# Patient Record
Sex: Male | Born: 1995 | Race: Black or African American | Hispanic: No | Marital: Single | Smoking: Former smoker
Health system: Southern US, Community
[De-identification: ages and names within clinical notes are randomized; demographics above are authoritative.]

## PROBLEM LIST (undated history)

## (undated) DIAGNOSIS — J45909 Unspecified asthma, uncomplicated: Secondary | ICD-10-CM

## (undated) HISTORY — DX: Unspecified asthma, uncomplicated: J45.909

---
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
## 2014-02-03 ENCOUNTER — Emergency Department (HOSPITAL_COMMUNITY)
Admission: EM | Admit: 2014-02-03 | Discharge: 2014-02-03 | Disposition: A | Discharge status: Home / Self Care | Payer: Medicaid Other | Attending: Emergency Medicine | Admitting: Emergency Medicine

## 2014-02-03 ENCOUNTER — Encounter (HOSPITAL_COMMUNITY): Payer: Self-pay | Admitting: Emergency Medicine

## 2014-02-03 DIAGNOSIS — Y92838 Other recreation area as the place of occurrence of the external cause: Secondary | ICD-10-CM

## 2014-02-03 DIAGNOSIS — S7000XA Contusion of unspecified hip, initial encounter: Secondary | ICD-10-CM | POA: Insufficient documentation

## 2014-02-03 DIAGNOSIS — Z791 Long term (current) use of non-steroidal anti-inflammatories (NSAID): Secondary | ICD-10-CM | POA: Insufficient documentation

## 2014-02-03 DIAGNOSIS — M549 Dorsalgia, unspecified: Secondary | ICD-10-CM | POA: Insufficient documentation

## 2014-02-03 DIAGNOSIS — Y9239 Other specified sports and athletic area as the place of occurrence of the external cause: Secondary | ICD-10-CM | POA: Insufficient documentation

## 2014-02-03 DIAGNOSIS — S7002XA Contusion of left hip, initial encounter: Secondary | ICD-10-CM

## 2014-02-03 DIAGNOSIS — Y9351 Activity, roller skating (inline) and skateboarding: Secondary | ICD-10-CM | POA: Insufficient documentation

## 2014-02-03 MED ORDER — TRAMADOL HCL 50 MG PO TABS: 50.0000 mg | ORAL_TABLET | Freq: Four times a day (QID) | ORAL | Status: DC | PRN

## 2014-02-03 MED ORDER — NAPROXEN 500 MG PO TABS: 500.0000 mg | ORAL_TABLET | Freq: Two times a day (BID) | ORAL | Status: DC

## 2014-02-03 NOTE — ED Provider Notes (Signed)
CSN: [id number]     Arrival date & time 02/03/14  1251 History  This chart was scribed for non-physician practitioner Renne CriglerJoshua Jamaria Amborn working with Enid SkeensJoshua M Zavitz, MD by Carl Bestelina Holson, ED Scribe. This patient was seen in room TR11C/TR11C and the patient's care was started at 2:17 PM.    Chief Complaint  Patient presents with  . Fall    HPI Comments: Donald Mcintosh is a 18 y.o. male who presents to the Emergency Department complaining of constant, non-radiating left hip pain that started after the patient fell off of a skateboard two days ago and landed on his left hip.  He states that he felt the metal prongs from his phone charger in his pocket stick into his left hip at the time of the fall.  The patient states that cough, walking, and laying down aggravates the pain.  He states that he has been applying ice to his left hip.     Patient is a 18 y.o. male presenting with fall. The history is provided by the patient. No language interpreter was used.  Fall    History reviewed. No pertinent past medical history. History reviewed. No pertinent past surgical history. History reviewed. No pertinent family history. History  Substance Use Topics  . Smoking status: Never Smoker   . Smokeless tobacco: Not on file  . Alcohol Use: No    Review of Systems  Constitutional: Negative for fever and unexpected weight change.  Gastrointestinal: Negative for constipation.       Neg for fecal incontinence  Genitourinary: Negative for hematuria, flank pain and difficulty urinating.       Negative for urinary incontinence or retention  Musculoskeletal: Positive for arthralgias and back pain. Negative for gait problem.  Skin: Positive for wound.  Neurological: Negative for weakness and numbness.       Negative for saddle paresthesias       Allergies  Review of patient's allergies indicates no known allergies.  Home Medications   Prior to Admission medications   Medication Sig Start Date End  Date Taking? Authorizing Provider  ACETAMINOPHEN PO Take 2 tablets by mouth once.    Historical Provider, MD  ibuprofen (ADVIL,MOTRIN) 800 MG tablet Take 1 tablet (800 mg total) by mouth 3 (three) times daily. 06/01/13   Hannah Muthersbaugh, PA-C   Triage Vitals: BP 148/88  Pulse 50  Temp(Src) 98.1 F (36.7 C) (Oral)  Resp 16  Ht 5\' 10"  (1.778 m)  Wt 192 lb 3.2 oz (87.181 kg)  BMI 27.58 kg/m2  SpO2 100%  Physical Exam  Nursing note and vitals reviewed. Constitutional: He appears well-developed and well-nourished.  HENT:  Head: Normocephalic and atraumatic.  Eyes: Conjunctivae and EOM are normal.  Neck: Normal range of motion. Neck supple.  Cardiovascular: Normal rate.   Pulmonary/Chest: Effort normal. No respiratory distress.  Abdominal: Soft. He exhibits no distension. There is no tenderness. There is no rebound and no guarding.  No abdominal or groin tenderness.   Musculoskeletal: Normal range of motion.       Left hip: He exhibits tenderness. He exhibits normal range of motion, normal strength and no bony tenderness.       Lumbar back: Normal.       Left upper leg: He exhibits no tenderness and no bony tenderness.       Legs: Patient with tenderness lateral to the L anterior superior iliac crest. No hematoma felt. Minimal abrasion L lateral thigh.   Neurological: He is alert.  Normal  gait.   Skin: Skin is warm and dry.  Psychiatric: He has a normal mood and affect. His behavior is normal.    ED Course  Procedures (including critical care time)  DIAGNOSTIC STUDIES: Oxygen Saturation is 100% on room air, normal by my interpretation.    COORDINATION OF CARE: 2:19 PM- Discussed a clinical suspicion of a contusion on the patient's left hip and advised the patient to treat his symptoms with Aleve.  The patient denied wanting to have an x-ray of his left hip.  The patent agreed to the treatment plan.      Labs Review Labs Reviewed - No data to display  Imaging Review No  results found.   EKG Interpretation None      Vital signs reviewed and are as follows: Filed Vitals:   02/03/14 1257  BP: 148/88  Pulse: 50  Temp: 98.1 F (36.7 C)  Resp: 16   2:30 PM Patient was counseled on RICE protocol and told to rest injury, use ice for no longer than 15 minutes every hour, compress the area, and elevate above the level of their heart as much as possible to reduce swelling. Questions answered. Patient verbalized understanding.    Patient counseled on use of narcotic pain medications. Counseled not to combine these medications with others containing tylenol. Urged not to drink alcohol, drive, or perform any other activities that requires focus while taking these medications. The patient verbalizes understanding and agrees with the plan.    MDM   Final diagnoses:  Contusion of left hip, initial encounter   Patients with left hip injury lateral to the iliac crest. No abdominal tenderness on exam. Patient is ambulatory and has full range of motion. Do not suspect a fracture. Low suspicion for pelvic fracture given the minimal pain. Patient agrees to monitor closely, defer x-ray unless pain does not improve over the next week. Will treat symptoms.  I personally performed the services described in this documentation, which was scribed in my presence. The recorded information has been reviewed and is accurate.    Renne CriglerJoshua Linard Daft, PA-C 02/03/14 1431

## 2014-02-03 NOTE — ED Notes (Signed)
He [place]. He states when he fell he felt the metal prongs from phone charger stick into his L hip and his hip has been hurting since.

## 2014-02-03 NOTE — Discharge Instructions (Signed)
Please read and follow all provided instructions.  Your diagnoses today include:  1. Contusion of left hip, initial encounter     Tests performed today include:  Vital signs - see below for your results today  Medications prescribed:   Tramadol - narcotic-like pain medication  DO NOT drive or perform any activities that require you to be awake and alert because this medicine can make you drowsy.    Naproxen - anti-inflammatory pain medication  Do not exceed 500mg  naproxen every 12 hours, take with food  You have been prescribed an anti-inflammatory medication or NSAID. Take with food. Take smallest effective dose for the shortest duration needed for your pain. Stop taking if you experience stomach pain or vomiting.   Take any prescribed medications only as directed.  Home care instructions:   Follow any educational materials contained in this packet  Please rest, use ice or heat on the injured area for the next several days  Do not lift, push, pull anything more than 10 pounds for the next week  Follow-up instructions: Please follow-up with your primary care provider in the next 1 week for further evaluation of your symptoms.   Return instructions:  SEEK IMMEDIATE MEDICAL ATTENTION IF YOU HAVE:  New numbness, tingling, weakness, or problem with the use of your arms or legs  Severe back pain not relieved with medications  Loss control of your bowels or bladder  Increasing pain in any areas of the body (such as chest or abdominal pain)  Shortness of breath, dizziness, or fainting.   Worsening nausea (feeling sick to your stomach), vomiting, fever, or sweats  Any other emergent concerns regarding your health   Additional Information:  Your vital signs today were: BP 148/88   Pulse 50   Temp(Src) 98.1 F (36.7 C) (Oral)   Resp 16   Ht 5\' 10"  (1.778 m)   Wt 192 lb 3.2 oz (87.181 kg)   BMI 27.58 kg/m2   SpO2 100% If your blood pressure (BP) was elevated above  135/85 this visit, please have this repeated by your doctor within one month. --------------

## 2014-02-03 NOTE — ED Notes (Signed)
C/O LLQ abd pain since falling off a skateboard 2 days ago. Small abrasion noted to left hip area. Points to area above the hip as site of pain. Tender with palpation. Denies N/V.

## 2014-02-03 NOTE — ED Provider Notes (Signed)
Medical screening examination/treatment/procedure(s) were performed by non-physician practitioner and as supervising physician I was immediately available for consultation/collaboration.   EKG Interpretation None        Cornelius Marullo M Draden Cottingham, MD 02/03/14 1536 

## 2015-07-05 ENCOUNTER — Emergency Department (HOSPITAL_COMMUNITY)
Admission: EM | Admit: 2015-07-05 | Discharge: 2015-07-05 | Disposition: A | Discharge status: Home / Self Care | Payer: Medicaid Other | Attending: Emergency Medicine | Admitting: Emergency Medicine

## 2015-07-05 ENCOUNTER — Encounter (HOSPITAL_COMMUNITY): Payer: Self-pay | Admitting: Emergency Medicine

## 2015-07-05 DIAGNOSIS — Z791 Long term (current) use of non-steroidal anti-inflammatories (NSAID): Secondary | ICD-10-CM | POA: Insufficient documentation

## 2015-07-05 DIAGNOSIS — M546 Pain in thoracic spine: Secondary | ICD-10-CM | POA: Insufficient documentation

## 2015-07-05 DIAGNOSIS — M549 Dorsalgia, unspecified: Secondary | ICD-10-CM

## 2015-07-05 DIAGNOSIS — M62838 Other muscle spasm: Secondary | ICD-10-CM | POA: Insufficient documentation

## 2015-07-05 MED ORDER — NAPROXEN 500 MG PO TABS: 500.0000 mg | ORAL_TABLET | Freq: Two times a day (BID) | ORAL | Status: DC

## 2015-07-05 MED ORDER — METHOCARBAMOL 500 MG PO TABS: 500.0000 mg | ORAL_TABLET | Freq: Two times a day (BID) | ORAL | Status: DC

## 2015-07-05 NOTE — Discharge Instructions (Signed)
Take the prescribed medication as directed. Remember proper lifting techniques, may wish to wear back brace to help support back. Return to the ED for new or worsening symptoms.

## 2015-07-05 NOTE — ED Notes (Addendum)
Pt states pain between shoulder blades that started a couple days ago. Unknown if injured at work or not. Pain increases with movement of arms. Denies numbness and tingling to extremities

## 2015-07-05 NOTE — ED Notes (Signed)
Pt. Stated, I had a sharpe pain in my upper back that started on Friday.

## 2015-07-05 NOTE — ED Provider Notes (Signed)
CSN: [id number]     Arrival date & time 07/05/15  1326 History   First MD Initiated Contact with Patient 07/05/15 1410     Chief Complaint  Patient presents with  . Back Pain     (Consider location/radiation/quality/duration/timing/severity/associated sxs/prior Treatment) Patient is a 19 y.o. male presenting with back pain. The history is provided by the patient and medical records.  Back Pain  19 year old male here with left upper back pain that began a few days ago.  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  He states pain is throbbing with intermittent stabbing sensations.  No chest pain or SOB.  No numbness, paresthesias or weakness of extremities.  No loss of bowel or bladder control.  Patient states similar back pain in the past that was due to pulled muscle in his low back.  He states he does not wear a back brace while working.  No intervention tried PTA.  History reviewed. No pertinent past medical history. History reviewed. No pertinent past surgical history. No family history on file. Social History  Substance Use Topics  . Smoking status: Never Smoker   . Smokeless tobacco: None  . Alcohol Use: No    Review of Systems  Musculoskeletal: Positive for back pain.  All other systems reviewed and are negative.     Allergies  Review of patient's allergies indicates no known allergies.  Home Medications   Prior to Admission medications   Medication Sig Start Date End Date Taking? Authorizing Provider  naproxen (NAPROSYN) 500 MG tablet Take 1 tablet (500 mg total) by mouth 2 (two) times daily. 02/03/14   Renne Crigler, PA-C  traMADol (ULTRAM) 50 MG tablet Take 1 tablet (50 mg total) by mouth every 6 (six) hours as needed. 02/03/14   Renne Crigler, PA-C   BP 137/80 mmHg  Pulse 64  Temp(Src) 98.1 F (36.7 C) (Oral)  Resp 20  SpO2 100%   Physical Exam  Constitutional: He is oriented to person, place, and  time. He appears well-developed and well-nourished.  Sleeping upright in chair upon entering room, NAD  HENT:  Head: Normocephalic and atraumatic.  Mouth/Throat: Oropharynx is clear and moist.  Eyes: Conjunctivae and EOM are normal. Pupils are equal, round, and reactive to light.  Neck: Normal range of motion.  Cardiovascular: Normal rate, regular rhythm and normal heart sounds.   Pulmonary/Chest: Effort normal and breath sounds normal. No respiratory distress. He has no wheezes.  Abdominal: Soft. Bowel sounds are normal. There is no tenderness. There is no CVA tenderness.  Musculoskeletal: Normal range of motion.       Thoracic back: He exhibits tenderness, pain and spasm. He exhibits no bony tenderness.       Back:  Neurological: He is alert and oriented to person, place, and time.  AAOx3, answering questions appropriately; equal strength UE and LE bilaterally; CN grossly intact; moves all extremities appropriately without ataxia; no focal neuro deficits or facial asymmetry appreciated  Skin: Skin is warm and dry.  Psychiatric: He has a normal mood and affect.  Nursing note and vitals reviewed.   ED Course  Procedures (including critical care time) Labs Review Labs Reviewed - No data to display  Imaging Review No results found. I have personally reviewed and evaluated these images and lab results as part of my medical decision-making.   EKG Interpretation None      MDM   Final diagnoses:  Back pain, unspecified  location   19 year old male here with left upper back pain began on Friday. Patient works for [employer], reports heavy lifting on a daily basis. Patient has tenderness of his left thoracic paraspinal region with spasm present. There is no midline deformity or step-off. His neurologic exam is nonfocal. No red flag symptoms to suggest cauda equina, spinal cord injury, or other acute emergent pathology.  Do not feel imaging needed at this time, suspect muscular strain from  repetitive lifting.  Rx naprosyn, robaxin.  Encouraged to wear back brace while working to help support back and maintain proper lifting technique.  Discussed plan with patient, he/she acknowledged understanding and agreed with plan of care.  Return precautions given for new or worsening symptoms.  Garlon HatchetLisa M Lamarion Mcevers, PA-C 07/05/15 1500  Geoffery Lyonsouglas Delo, MD 07/05/15 [phone]

## 2019-02-14 ENCOUNTER — Other Ambulatory Visit: Payer: Self-pay | Admitting: *Deleted

## 2019-02-14 DIAGNOSIS — Z20822 Contact with and (suspected) exposure to covid-19: Secondary | ICD-10-CM

## 2019-02-18 NOTE — Addendum Note (Signed)
Addended by: Brigitte Pulse on: 02/18/2019 05:31 PM   Modules accepted: Orders

## 2019-07-17 ENCOUNTER — Other Ambulatory Visit: Payer: Self-pay

## 2019-07-17 ENCOUNTER — Emergency Department (HOSPITAL_COMMUNITY)
Admission: EM | Admit: 2019-07-17 | Discharge: 2019-07-17 | Disposition: A | Discharge status: Home / Self Care | Payer: Self-pay | Attending: Emergency Medicine | Admitting: Emergency Medicine

## 2019-07-17 ENCOUNTER — Emergency Department (HOSPITAL_COMMUNITY): Payer: Self-pay

## 2019-07-17 ENCOUNTER — Encounter (HOSPITAL_COMMUNITY): Payer: Self-pay

## 2019-07-17 DIAGNOSIS — Y929 Unspecified place or not applicable: Secondary | ICD-10-CM | POA: Insufficient documentation

## 2019-07-17 DIAGNOSIS — S93602A Unspecified sprain of left foot, initial encounter: Secondary | ICD-10-CM | POA: Insufficient documentation

## 2019-07-17 DIAGNOSIS — Z79899 Other long term (current) drug therapy: Secondary | ICD-10-CM | POA: Insufficient documentation

## 2019-07-17 DIAGNOSIS — Y999 Unspecified external cause status: Secondary | ICD-10-CM | POA: Insufficient documentation

## 2019-07-17 DIAGNOSIS — Y9301 Activity, walking, marching and hiking: Secondary | ICD-10-CM | POA: Insufficient documentation

## 2019-07-17 DIAGNOSIS — W109XXA Fall (on) (from) unspecified stairs and steps, initial encounter: Secondary | ICD-10-CM | POA: Insufficient documentation

## 2019-07-17 MED ORDER — HYDROCODONE-ACETAMINOPHEN 5-325 MG PO TABS
1.0000 | ORAL_TABLET | Freq: Once | ORAL | Status: AC
  Administered 2019-07-17: 20:00:00 1 via ORAL
  Filled 2019-07-17: qty 1

## 2019-07-17 NOTE — ED Triage Notes (Signed)
Pt arrives POV for eval of L sided foot pain after falling down some stairs this AM. Pt reports that his L pinkie and L big toe feel broken. Swelling, WBAT in triage

## 2019-07-17 NOTE — ED Provider Notes (Signed)
[facility] EMERGENCY DEPARTMENT Provider Note   CSN: [id number] Arrival date & time: 07/17/19  1850   History Chief Complaint  Patient presents with  . Foot Pain   Donald Mcintosh is a 23 y.o. male with past medical history who presents for evaluation of left foot pain.  Patient states he tripped and fell down approximately 5 steps this morning. No LOC, anticoagulation, hitting head. He has been ambulatory since the incident however has pain to his left foot.  Rates his current pain a 8/10.  Denies radiation of pain.  Admits to some right lateral foot swelling since the incident.  Denies fever, chills, nausea, vomiting, headache, paresthesias, redness, warmth, lacerations.  Denies additional rating or alleviating factors.  Has not take anything for symptoms.  History obtained from patient and past medical records. No interpreter was used  HPI     History reviewed. No pertinent past medical history.  There are no problems to display for this patient.   History reviewed. No pertinent surgical history.     History reviewed. No pertinent family history.  Social History   Tobacco Use  . Smoking status: Never Smoker  Substance Use Topics  . Alcohol use: No  . Drug use: No    Home Medications Prior to Admission medications   Medication Sig Start Date End Date Taking? Authorizing Provider  methocarbamol (ROBAXIN) 500 MG tablet Take 1 tablet (500 mg total) by mouth 2 (two) times daily. 07/05/15   Garlon Hatchet, PA-C  naproxen (NAPROSYN) 500 MG tablet Take 1 tablet (500 mg total) by mouth 2 (two) times daily with a meal. 07/05/15   Garlon Hatchet, PA-C  traMADol (ULTRAM) 50 MG tablet Take 1 tablet (50 mg total) by mouth every 6 (six) hours as needed. 02/03/14   Renne Crigler, PA-C    Allergies    Patient has no known allergies.  Review of Systems   Review of Systems  Constitutional: Negative.   HENT: Negative.   Respiratory: Negative.   Cardiovascular:  Negative.   Gastrointestinal: Negative.   Genitourinary: Negative.   Musculoskeletal: Positive for gait problem (Limp). Negative for arthralgias, back pain, joint swelling, myalgias, neck pain and neck stiffness.       Left foot pain  Skin: Negative.   All other systems reviewed and are negative.   Physical Exam Updated Vital Signs BP 125/87 (BP Location: Right Arm)   Pulse 89   Temp 98.8 F (37.1 C) (Oral)   Resp 16   Ht 5\' 10"  (1.778 m)   Wt 108.9 kg   SpO2 98%   BMI 34.44 kg/m   Physical Exam Vitals and nursing note reviewed.  Constitutional:      General: He is not in acute distress.    Appearance: He is well-developed. He is not ill-appearing, toxic-appearing or diaphoretic.  HENT:     Head: Normocephalic and atraumatic. No raccoon eyes, Battle's sign, abrasion or contusion.     Jaw: There is normal jaw occlusion.     Comments: No contusions or abrasions.    Right Ear: No hemotympanum.     Left Ear: No hemotympanum.  Eyes:     Pupils: Pupils are equal, round, and reactive to light.  Neck:     Trachea: Phonation normal.     Comments: No midline cervical tenderness palpation.  Full range of motion without difficulty. Cardiovascular:     Rate and Rhythm: Normal rate and regular rhythm.     Pulses: Normal  pulses.          Dorsalis pedis pulses are 2+ on the right side and 2+ on the left side.       Posterior tibial pulses are 2+ on the right side and 2+ on the left side.     Heart sounds: Normal heart sounds.  Pulmonary:     Effort: Pulmonary effort is normal. No respiratory distress.  Abdominal:     General: Bowel sounds are normal. There is no distension.     Palpations: Abdomen is soft.     Tenderness: There is no abdominal tenderness.  Musculoskeletal:     Cervical back: Normal, full passive range of motion without pain, normal range of motion and neck supple.     Thoracic back: Normal.     Lumbar back: Normal.     Right hip: Normal.     Left hip: Normal.      Right lower leg: Normal.     Left lower leg: Normal.     Right ankle: Normal.     Right Achilles Tendon: Normal.     Left ankle: Normal.     Left Achilles Tendon: Normal.     Right foot: Normal.     Left foot: Decreased range of motion. Normal capillary refill. Swelling and tenderness present. No deformity, bunion, Charcot foot, foot drop, prominent metatarsal heads, laceration or crepitus. Normal pulse.       Feet:     Comments: No midline spinal tenderness palpation, crepitus, stepoffs.  Moves all 4 extremities freely.  Pelvis stable, nontender palpation.  No shortening or rotation of leg.  Compartment soft.  No tenderness of bilateral tibia/fibula.  Feet:     Right foot:     Skin integrity: Skin integrity normal.     Toenail Condition: Right toenails are abnormally thick.     Left foot:     Skin integrity: Skin integrity normal.     Toenail Condition: Left toenails are abnormally thick.     Comments: Mild decreased range of motion with plantarflexion dorsiflexion to left ankle secondary to pain.  He has no tenderness over his malleolus.  No tenderness over navicular, metatarsal heads. Skin:    General: Skin is warm and dry.     Capillary Refill: Capillary refill takes less than 2 seconds.     Comments: Mild soft tissue swelling over lateral aspect of right foot.  No swelling, redness or warmth to ankles.  Neurological:     General: No focal deficit present.     Mental Status: He is alert.     Comments: Brisk capillary refill, intact sensation.  Ambulatory with limp to left foot secondary to pain.    ED Results / Procedures / Treatments   Labs (all labs ordered are listed, but only abnormal results are displayed) Labs Reviewed - No data to display  EKG None  Radiology DG Tibia/Fibula Left  Result Date: 07/17/2019 CLINICAL DATA:  Pain EXAM: LEFT TIBIA AND FIBULA - 2 VIEW COMPARISON:  None. FINDINGS: There is no evidence of fracture or other focal bone lesions. Soft  tissues are unremarkable. An os trigonum is noted. IMPRESSION: Negative. Electronically Signed   By: Constance Holster M.D.   On: 07/17/2019 20:18   DG Foot Complete Left  Result Date: 07/17/2019 CLINICAL DATA:  Foot pain. Left foot pain both medially and laterally. EXAM: LEFT FOOT - COMPLETE 3+ VIEW COMPARISON:  None. FINDINGS: There is no evidence of fracture or dislocation. There is no evidence of  arthropathy or other focal bone abnormality. Soft tissues are unremarkable. IMPRESSION: Negative. Electronically Signed   By: Katherine Mantlehristopher  Green M.D.   On: 07/17/2019 20:18    Procedures Procedures (including critical care time)  Medications Ordered in ED Medications  HYDROcodone-acetaminophen (NORCO/VICODIN) 5-325 MG per tablet 1 tablet (1 tablet Oral Given 07/17/19 1954)   ED Course  I have reviewed the triage vital signs and the nursing notes.  Pertinent labs & imaging results that were available during my care of the patient were reviewed by me and considered in my medical decision making (see chart for details).  23 year old presents for evaluation after mechanical fall.  Ambulatory with limp.  Some tenderness palpation to lateral aspect to left foot some mild soft tissue swelling.  No lacerations, contusions or abrasions.  Neurovascularly intact. Compartments soft. No midline spinal tenderness, LOC, anticoagulation hitting head.  Plan for pain management, plain films reevaluate  Plain films without acute fracture, dislocation or effusion.   Will dc home with crutches, splint and F/U with Ortho. RICE for symptomatic management.  The patient has been appropriately medically screened and/or stabilized in the ED. I have low suspicion for any other emergent medical condition which would require further screening, evaluation or treatment in the ED or require inpatient management.     MDM Rules/Calculators/A&P                      Final Clinical Impression(s) / ED Diagnoses Final  diagnoses:  Sprain of left foot, initial encounter    Rx / DC Orders ED Discharge Orders    None       Natalyah Cummiskey A, PA-C 07/17/19 2036    Gerhard MunchLockwood, Robert, MD 07/17/19 2304

## 2019-07-17 NOTE — ED Notes (Signed)
Patient Alert and oriented to baseline. Stable and ambulatory to baseline. Patient verbalized understanding of the discharge instructions.  Patient belongings were taken by the patient. Taken by wheelchair to lobby by NT. Pt stated he was proficient with crutch use at time of discharge.

## 2019-07-17 NOTE — Discharge Instructions (Signed)
Make sure to ice and elevate your foot.  Follow-up with orthopedics.  May take Tylenol and ibuprofen as needed for pain.

## 2019-09-17 NOTE — Progress Notes (Signed)
CHIEF COMPLAINT / HPI:  ADHD Is previously diagnosed with ADHD and medicated with stimulants.  Reports that he stopped taking all of his medications once he turned 17 because he did not like the way they made him feel and he wanted to enjoy the liberty of being an adult to making his own decisions.  He is currently employed with [employer] in a position that does not require him to stay seated and concentrate for long periods.  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  He has no desire for medication at this time.  Bipolar Diagnoses bipolar disorder at the age of 79.  He reports that this diagnosis is following a likely manic episode during which he punched a cough.  He was previously seen and prescribed medication through [facility].  He stopped taking all medications at 17.  He reports that he has frequently experienced mild depressive or more severely depressive symptoms although he does not recall experiencing specific episodes of mania.  His current PHQ-9 score is 15.  He denies SI/HI. 16 [facility]  Nicotine use Current daily smoker.  Is planning to quit on his birthday this year.  Current quit date is 09/28/2019.  He is not interested in any additional resources or medication to help quit at this time.    Tinea pedis He has been having for ongoing foot itching and occasional skin breakdown between his toes for the past several weeks.  Social situation Currently lives at home with his fiance and 3 children.  He is currently sexually active and is not using any form of birth control.  He is interested in speaking with urology regarding a vasectomy.  PERTINENT  PMH / PSH: Bipolar disorder, ADHD, nicotine use, marijuana use   OBJECTIVE: BP 130/84   Pulse 78   Ht 5\' 11"  (1.803 m)   Wt 230 lb (104.3 kg)   SpO2 100%   BMI 32.08 kg/m   General: Alert and cooperative and appears to be in no acute distress Cardio: Normal S1 and S2,  no S3 or S4. Rhythm is regular. No murmurs or rubs.   Pulm: Clear to auscultation bilaterally, no crackles, wheezing, or diminished breath sounds. Normal respiratory effort Abdomen: Bowel sounds normal. Abdomen soft and non-tender.  Extremities: No peripheral edema. Warm/ well perfused.  Strong radial pulse.  Skin between toes shows evidence of maceration.  Some moisture accumulation in soles of feet and between toes.  No significant malodor.  Mild hematoma noted beneath the nail of his right first toe.  Picture under media. Neuro: Cranial nerves grossly intact  Media Information   Document Information  Photos    09/18/2019 09:36  Attached To:  Ruffin Pyo  Source Information  Matilde Haymaker, MD  [facility] Resident     ASSESSMENT / PLAN:  Tinea pedis -Lotrimin twice daily for 2-4 weeks  Bipolar disorder ([place]) No current medication.  Currently experiencing moderate depressive symptoms.  No suicidality.  He is interested in reestablishing with [facility] for appropriate treatment. -Follow-up with [facility]  Unwanted fertility -Ambulatory referral to urology to discuss vasectomy  Current smoker Interested in quitting.  Current quit date 09/28/2019. -Not interested in medication at this time -Smoking cessation handout and resources provided   Some family history of diabetes and heart issues.  Will screen for diabetes and hyperlipidemia today.  We will also screen for STIs according to USPS TF recommendations. -Follow-up BMP -Follow-up lipid panel -Follow-up HIV, RPR,  hep Angelena Form, MD [facility]

## 2019-09-18 ENCOUNTER — Encounter: Payer: Self-pay | Admitting: Family Medicine

## 2019-09-18 ENCOUNTER — Ambulatory Visit (INDEPENDENT_AMBULATORY_CARE_PROVIDER_SITE_OTHER): Payer: Self-pay | Admitting: Family Medicine

## 2019-09-18 ENCOUNTER — Other Ambulatory Visit: Payer: Self-pay

## 2019-09-18 VITALS — BP 130/84 | HR 78 | Ht 71.0 in | Wt 230.0 lb

## 2019-09-18 DIAGNOSIS — F3170 Bipolar disorder, currently in remission, most recent episode unspecified: Secondary | ICD-10-CM

## 2019-09-18 DIAGNOSIS — B353 Tinea pedis: Secondary | ICD-10-CM

## 2019-09-18 DIAGNOSIS — Z13220 Encounter for screening for lipoid disorders: Secondary | ICD-10-CM

## 2019-09-18 DIAGNOSIS — Z131 Encounter for screening for diabetes mellitus: Secondary | ICD-10-CM

## 2019-09-18 DIAGNOSIS — F17200 Nicotine dependence, unspecified, uncomplicated: Secondary | ICD-10-CM | POA: Insufficient documentation

## 2019-09-18 DIAGNOSIS — Z113 Encounter for screening for infections with a predominantly sexual mode of transmission: Secondary | ICD-10-CM

## 2019-09-18 DIAGNOSIS — Z3009 Encounter for other general counseling and advice on contraception: Secondary | ICD-10-CM | POA: Insufficient documentation

## 2019-09-18 DIAGNOSIS — F319 Bipolar disorder, unspecified: Secondary | ICD-10-CM | POA: Insufficient documentation

## 2019-09-18 DIAGNOSIS — Z8659 Personal history of other mental and behavioral disorders: Secondary | ICD-10-CM

## 2019-09-18 MED ORDER — CLOTRIMAZOLE 1 % EX CREA: 1.0000 "application " | TOPICAL_CREAM | Freq: Two times a day (BID) | CUTANEOUS | 1 refills | Status: DC

## 2019-09-18 NOTE — Assessment & Plan Note (Signed)
-  Ambulatory referral to urology to discuss vasectomy

## 2019-09-18 NOTE — Assessment & Plan Note (Signed)
Interested in quitting.  Current quit date 09/28/2019. -Not interested in medication at this time -Smoking cessation handout and resources provided

## 2019-09-18 NOTE — Assessment & Plan Note (Signed)
No current medication.  Currently experiencing moderate depressive symptoms.  No suicidality.  He is interested in reestablishing with [facility] for appropriate treatment. -Follow-up with [facility]

## 2019-09-18 NOTE — Patient Instructions (Addendum)
We covered a lot of things today.  Here's a quick summary:  Bipolar - Please reestablish with [facility]  Smoking cessation - I have attached infomration below.  Please contact us if you would like additional help with quitting.  Athlete's foot - this is a fungal infection.  Apply lotrimin twice daily for 2-4 weeks.  Let us know if this does not lead to improvement.  Steps to Quit Smoking Smoking tobacco is the leading cause of preventable death. It can affect almost every organ in the body. Smoking puts you and people around you at risk for many serious, long-lasting (chronic) diseases. Quitting smoking can be hard, but it is one of the best things that you can do for your health. It is never too late to quit. How do I get ready to quit? When you decide to quit smoking, make a plan to help you succeed. Before you quit:  Pick a date to quit. Set a date within the next 2 weeks to give you time to prepare.  Write down the reasons why you are quitting. Keep this list in places where you will see it often.  Tell your family, friends, and co-workers that you are quitting. Their support is important.  Talk with your doctor about the choices that may help you quit.  Find out if your health insurance will pay for these treatments.  Know the people, places, things, and activities that make you want to smoke (triggers). Avoid them. What first steps can I take to quit smoking?  Throw away all cigarettes at home, at work, and in your car.  Throw away the things that you use when you smoke, such as ashtrays and lighters.  Clean your car. Make sure to empty the ashtray.  Clean your home, including curtains and carpets. What can I do to help me quit smoking? Talk with your doctor about taking medicines and seeing a counselor at the same time. You are more likely to succeed when you do both.  If you are pregnant or breastfeeding, talk with your doctor about counseling or other ways to quit smoking.  Do not take medicine to help you quit smoking unless your doctor tells you to do so. To quit smoking: Quit right away  Quit smoking totally, instead of slowly cutting back on how much you smoke over a period of time.  Go to counseling. You are more likely to quit if you go to counseling sessions regularly. Take medicine You may take medicines to help you quit. Some medicines need a prescription, and some you can buy over-the-counter. Some medicines may contain a drug called nicotine to replace the nicotine in cigarettes. Medicines may:  Help you to stop having the desire to smoke (cravings).  Help to stop the problems that come when you stop smoking (withdrawal symptoms). Your doctor may ask you to use:  Nicotine patches, gum, or lozenges.  Nicotine inhalers or sprays.  Non-nicotine medicine that is taken by mouth. Find resources Find resources and other ways to help you quit smoking and remain smoke-free after you quit. These resources are most helpful when you use them often. They include:  Online chats with a Veterinary surgeon.  Phone quitlines.  Printed Materials engineer.  Support groups or group counseling.  Text messaging programs.  Mobile phone apps. Use apps on your mobile phone or tablet that can help you stick to your quit plan. There are many free apps for mobile phones and tablets as well as websites. Examples include  Quit Guide from the [employer] and smokefree.gov  What things can I do to make it easier to quit?   Talk to your family and friends. Ask them to support and encourage you.  Call a phone quitline (1-800-QUIT-NOW), reach out to support groups, or work with a Social worker.  Ask people who smoke to not smoke around you.  Avoid places that make you want to smoke, such as: ? Bars. ? Parties. ? Smoke-break areas at work.  Spend time with people who do not smoke.  Lower the stress in your life. Stress can make you want to smoke. Try these things to help your  stress: ? Getting regular exercise. ? Doing deep-breathing exercises. ? Doing yoga. ? Meditating. ? Doing a body scan. To do this, close your eyes, focus on one area of your body at a time from head to toe. Notice which parts of your body are tense. Try to relax the muscles in those areas. How will I feel when I quit smoking? Day 1 to 3 weeks Within the first 24 hours, you may start to have some problems that come from quitting tobacco. These problems are very bad 2-3 days after you quit, but they do not often last for more than 2-3 weeks. You may get these symptoms:  Mood swings.  Feeling restless, nervous, angry, or annoyed.  Trouble concentrating.  Dizziness.  Strong desire for high-sugar foods and nicotine.  Weight gain.  Trouble pooping (constipation).  Feeling like you may vomit (nausea).  Coughing or a sore throat.  Changes in how the medicines that you take for other issues work in your body.  Depression.  Trouble sleeping (insomnia). Week 3 and afterward After the first 2-3 weeks of quitting, you may start to notice more positive results, such as:  Better sense of smell and taste.  Less coughing and sore throat.  Slower heart rate.  Lower blood pressure.  Clearer skin.  Better breathing.  Fewer sick days. Quitting smoking can be hard. Do not give up if you fail the first time. Some people need to try a few times before they succeed. Do your best to stick to your quit plan, and talk with your doctor if you have any questions or concerns. Summary  Smoking tobacco is the leading cause of preventable death. Quitting smoking can be hard, but it is one of the best things that you can do for your health.  When you decide to quit smoking, make a plan to help you succeed.  Quit smoking right away, not slowly over a period of time.  When you start quitting, seek help from your doctor, family, or friends. This information is not intended to replace advice  given to you by your health care provider. Make sure you discuss any questions you have with your health care provider. Document Revised: 04/12/2019 Document Reviewed: 10/06/2018 Elsevier Patient Education  [place].

## 2019-09-18 NOTE — Assessment & Plan Note (Signed)
-  Lotrimin twice daily for 2-4 weeks

## 2019-09-19 LAB — HEPATITIS C ANTIBODY: Hep C Virus Ab: <0.1 s/co ratio (ref 0.0–0.9)

## 2019-09-19 LAB — LIPID PANEL
Chol/HDL Ratio: 3.6 ratio (ref 0.0–5.0)
Cholesterol, Total: 169 mg/dL (ref 100–199)
HDL: 47 mg/dL (ref >39)
LDL Chol Calc (NIH): 112 mg/dL — ABNORMAL HIGH (ref 0–99)
Triglycerides: 48 mg/dL (ref 0–149)
VLDL Cholesterol Cal: 10 mg/dL (ref 5–40)

## 2019-09-19 LAB — BASIC METABOLIC PANEL
BUN/Creatinine Ratio: 14 (ref 9–20)
BUN: 14 mg/dL (ref 6–20)
CO2: 21 mmol/L (ref 20–29)
Calcium: 9 mg/dL (ref 8.7–10.2)
Chloride: 106 mmol/L (ref 96–106)
Creatinine, Ser: 0.98 mg/dL (ref 0.76–1.27)
GFR calc Af Amer: 124 mL/min/{1.73_m2} (ref >59)
GFR calc non Af Amer: 107 mL/min/{1.73_m2} (ref >59)
Glucose: 101 mg/dL — ABNORMAL HIGH (ref 65–99)
Potassium: 4.7 mmol/L (ref 3.5–5.2)
Sodium: 140 mmol/L (ref 134–144)

## 2019-09-19 LAB — RPR: RPR Ser Ql: NONREACTIVE

## 2019-09-19 LAB — HIV ANTIBODY (ROUTINE TESTING W REFLEX): HIV Screen 4th Generation wRfx: NONREACTIVE

## 2019-09-20 ENCOUNTER — Encounter: Payer: Self-pay | Admitting: Family Medicine

## 2020-01-04 ENCOUNTER — Emergency Department (HOSPITAL_COMMUNITY)
Admission: EM | Admit: 2020-01-04 | Discharge: 2020-01-05 | Disposition: A | Discharge status: Home / Self Care | Payer: Self-pay | Attending: Emergency Medicine | Admitting: Emergency Medicine

## 2020-01-04 ENCOUNTER — Other Ambulatory Visit: Payer: Self-pay

## 2020-01-04 ENCOUNTER — Encounter (HOSPITAL_COMMUNITY): Payer: Self-pay

## 2020-01-04 DIAGNOSIS — R109 Unspecified abdominal pain: Secondary | ICD-10-CM | POA: Insufficient documentation

## 2020-01-04 DIAGNOSIS — Z5321 Procedure and treatment not carried out due to patient leaving prior to being seen by health care provider: Secondary | ICD-10-CM | POA: Insufficient documentation

## 2020-01-04 LAB — COMPREHENSIVE METABOLIC PANEL
ALT: 68 U/L — ABNORMAL HIGH (ref 0–44)
AST: 59 U/L — ABNORMAL HIGH (ref 15–41)
Albumin: 3.9 g/dL (ref 3.5–5.0)
Alkaline Phosphatase: 86 U/L (ref 38–126)
Anion gap: 13 (ref 5–15)
BUN: 10 mg/dL (ref 6–20)
CO2: 25 mmol/L (ref 22–32)
Calcium: 9.2 mg/dL (ref 8.9–10.3)
Chloride: 105 mmol/L (ref 98–111)
Creatinine, Ser: 1.07 mg/dL (ref 0.61–1.24)
GFR calc Af Amer: >60 mL/min (ref >60)
GFR calc non Af Amer: >60 mL/min (ref >60)
Glucose, Bld: 122 mg/dL — ABNORMAL HIGH (ref 70–99)
Potassium: 3.7 mmol/L (ref 3.5–5.1)
Sodium: 143 mmol/L (ref 135–145)
Total Bilirubin: 0.5 mg/dL (ref 0.3–1.2)
Total Protein: 6.7 g/dL (ref 6.5–8.1)

## 2020-01-04 LAB — URINALYSIS, ROUTINE W REFLEX MICROSCOPIC
Bilirubin Urine: NEGATIVE
Glucose, UA: NEGATIVE mg/dL
Hgb urine dipstick: NEGATIVE
Ketones, ur: NEGATIVE mg/dL
Leukocytes,Ua: NEGATIVE
Nitrite: NEGATIVE
Protein, ur: NEGATIVE mg/dL
Specific Gravity, Urine: 1.015 (ref 1.005–1.030)
pH: 5 (ref 5.0–8.0)

## 2020-01-04 LAB — CBC
HCT: 41.5 % (ref 39.0–52.0)
Hemoglobin: 13.6 g/dL (ref 13.0–17.0)
MCH: 31 pg (ref 26.0–34.0)
MCHC: 32.8 g/dL (ref 30.0–36.0)
MCV: 94.5 fL (ref 80.0–100.0)
Platelets: 264 10*3/uL (ref 150–400)
RBC: 4.39 MIL/uL (ref 4.22–5.81)
RDW: 12.3 % (ref 11.5–15.5)
WBC: 5.5 10*3/uL (ref 4.0–10.5)
nRBC: 0 % (ref 0.0–0.2)

## 2020-01-04 LAB — LIPASE, BLOOD: Lipase: 28 U/L (ref 11–51)

## 2020-01-04 NOTE — ED Triage Notes (Signed)
Pt arrives POV for eval of centralized abd pain x 2 days. Endorses nausea, denies fever/vomiting/sweating

## 2020-01-05 NOTE — ED Notes (Signed)
Pt called x3 for room no response 

## 2020-07-18 ENCOUNTER — Ambulatory Visit (HOSPITAL_COMMUNITY)
Admission: EM | Admit: 2020-07-18 | Discharge: 2020-07-18 | Disposition: A | Discharge status: Home / Self Care | Payer: Medicaid Other | Attending: Family Medicine | Admitting: Family Medicine

## 2020-07-18 ENCOUNTER — Other Ambulatory Visit: Payer: Self-pay

## 2020-07-18 NOTE — ED Triage Notes (Signed)
PT called x2 no answer 

## 2020-07-18 NOTE — ED Notes (Signed)
Pt called in lobby x 2 no answer.  

## 2020-08-12 ENCOUNTER — Ambulatory Visit (INDEPENDENT_AMBULATORY_CARE_PROVIDER_SITE_OTHER): Payer: Self-pay | Admitting: Family Medicine

## 2020-08-12 ENCOUNTER — Other Ambulatory Visit: Payer: Self-pay

## 2020-08-12 ENCOUNTER — Encounter: Payer: Self-pay | Admitting: Family Medicine

## 2020-08-12 VITALS — BP 130/84 | HR 85 | Wt 236.0 lb

## 2020-08-12 DIAGNOSIS — M79675 Pain in left toe(s): Secondary | ICD-10-CM

## 2020-08-12 DIAGNOSIS — R748 Abnormal levels of other serum enzymes: Secondary | ICD-10-CM | POA: Insufficient documentation

## 2020-08-12 DIAGNOSIS — Z3009 Encounter for other general counseling and advice on contraception: Secondary | ICD-10-CM

## 2020-08-12 DIAGNOSIS — M79676 Pain in unspecified toe(s): Secondary | ICD-10-CM | POA: Insufficient documentation

## 2020-08-12 DIAGNOSIS — F3170 Bipolar disorder, currently in remission, most recent episode unspecified: Secondary | ICD-10-CM

## 2020-08-12 NOTE — Progress Notes (Signed)
    SUBJECTIVE:   CHIEF COMPLAINT / HPI:   Toe pain Mr. Sargent notes that he fractured his toe many years ago and believes he now has some chronic toe pain as a result.  His left first toe is now very inflexible compared to his other toes and he notices frequent discomfort in the first and second joint of his toe.  He walks a lot for his job and this is the main time that he notices this discomfort.  He currently wears some soft soled shoes for work.  COVID recovery Mr. Repetto notes that he tested positive for COVID in the middle of December and wanted to know if any further testing would be necessary for him at this time.  He notes that he has no issues with shortness of breath or chest pain in the last several days.  He is back to her normal activity level at this time.  Mood disorder Is a PHQ-9 score of 9 today with a answers 0 for question 9.  He does not Dors increased moodiness lately and does occasionally think of cutting himself although he has not engaged in any cutting activity recently and does not have a plan.  He has a diagnosis of bipolar disorder but took himself off medication as soon as he turned 44.  He is not interested in establishing care with a psychiatrist right now but he is willing to touch base with a therapist.  PERTINENT  PMH / PSH: Bipolar disorder, current smoker  OBJECTIVE:   BP 130/84   Pulse 85   Wt 236 lb (107 kg)   SpO2 97%   BMI 32.92 kg/m    General: Alert and cooperative and appears to be in no acute distress Respiratory: Breathing comfortably on room air. Extremities: Warm, dry. Left first toe: Inspection: Normal morphology, no obvious, rash or Palpation: No significant tenderness to palpation of the MTP or interphalangeal joint. ROM: Limited range of motion with active flexion and extension of the first toe.  Range of motion limited due to stiffness and to discomfort. Neurovascularly intact No special testing.  ASSESSMENT/PLAN:   Bipolar  disorder (HCC) He was encouraged to establish care with a psychiatrist was not interested at the time of the visit.  She was open to establishing care with a therapist. -List of therapy resources provided -He was encouraged to follow-up for worsened symptoms  Unwanted fertility He noted that [facility] but they said they did not accept family-planning Medicaid.  He would like to know if there are any additional providers who might except family-planning Medicaid. -Message sent to referral coordinator  Toe pain This is a chronic issue.  No concern for new fracture.  No need for x-rays at this time.  He was encouraged to continue using Tylenol and Motrin for pain control.  We discussed referring to physical therapy although I am not confident that many exercises can be provided for this particular issue.  He noted that he would like to be at least evaluated by physical therapy. -Placed ambulatory referral to physical therapy. -He was encouraged to use a stiff soled shoe for work  Elevated liver enzymes Last CMP at an emergency room visit noted mildly elevated liver enzymes.  He feels well today and we will repeat a CMP to ensure his have returned to normal. -Follow-up CMP     Mirian Mo, MD [facility]

## 2020-08-12 NOTE — Patient Instructions (Signed)
It was great to see you today.  Here is a quick review of the things we talked about:  Liver enzymes: We are going to recheck your liver enzymes today to make sure they are not still elevated.  Toe pain: I am going to put in referral to physical therapy.  I am not positive that they can help very much but I think it would be worth having an evaluation done.  You should get a call in the next 1-2 weeks set up an appointment.  Depression: I am sorry to hear that your depressive symptoms are little worse lately.  I have included a list of therapist below.  Please consider reaching out to establish care with a therapist in the community.  I would prefer you be seen by psychiatrist regularly to disorder.  If that is not possible at now, please return to clinic if you feel that your symptoms are worsening.   If all of your labs are normal, I will send you a message over my chart or send you a letter.  If there is anything to discuss, I will give you a phone call.  Outpatient Mental Health Providers (No Insurance required or Self Pay)  *[place] National Suicide Hotline 762-245-4241 (TALK)  [facility]  [address] [place], [place] [phone] Crisis [phone]  [facility]) can see uninsured folks for outpatient therapy [email or web address]-[email or web address] [address] [place], [place] [address] [phone]  [facility]    Walk-in Mon-Fri, 8am-3pm [email or web address] Scull [address] [place], [place]  [phone]   [address]  [place] [phone] [facility] for psych med management, there may be a wait- if [facility] is working with clients for OPT, they will coordinate with [facility] for psych  [facility]   Walk-in-Clinic: Monday- Friday 9:00 AM - 4:00 PM [address]   [place], [place] [phone]  [facility] [place] ([employer]) walk in M-F 8am-12pm and  1pm-3pm [place]- [address]     [phone]  [employer] [address]  Phone: [phone]  [employer] (Mental Health and substance challenges) [address]   [place] [phone]    [email or web address]    [facility]  [place] -[address], [place]     Phone:  [phone] [place]-  [address]  [phone]   [facility]  [address] [place]  [phone] [email or web address]   [facility] ( virtual or zoom therapy) [email or web address]  [address] [place]  [phone]    [facility] [phone]  grief counseling, dementia and caregiver support    [facility] Walk-in MWF 12:30 to 3:00     [address] [place] [address]  [phone]  www.[email or web address] call to schedule an appointment    [facility]ental [facility] Classes ,Support group, Peer support services, [address], [place], [place] [address] [phone]  PhotoSolver.pl           [facility] on Mental Illness (NAMI) [place]- Wellness classes, Support groups        [address] [place], [place] [address] [phone]   [email or web address]   [facility]  (Psycho-social Rehabilitation clubhouse, Individual and group therapy) [address] [place], [place] [address]   [phone]  24- Hour Availability:  [facility] [phone]  or [phone] * [facility] [employer] (Domestic Violence, Rape, etc. )([phone] [place] [employer] [phone] or [phone] * [facility] [phone] only) [phone] (after hours) *[facility] [phone] *[place] National Suicide Hotline 854-081-0492 Len Childs)

## 2020-08-12 NOTE — Assessment & Plan Note (Addendum)
This is a chronic issue.  No concern for new fracture.  No need for x-rays at this time.  He was encouraged to continue using Tylenol and Motrin for pain control.  We discussed referring to physical therapy although I am not confident that many exercises can be provided for this particular issue.  He noted that he would like to be at least evaluated by physical therapy. -Placed ambulatory referral to physical therapy. -He was encouraged to use a stiff soled shoe for work

## 2020-08-12 NOTE — Assessment & Plan Note (Signed)
He noted that [facility] but they said they did not accept family-planning Medicaid.  He would like to know if there are any additional providers who might except family-planning Medicaid. -Message sent to referral coordinator

## 2020-08-12 NOTE — Assessment & Plan Note (Signed)
He was encouraged to establish care with a psychiatrist was not interested at the time of the visit.  She was open to establishing care with a therapist. -List of therapy resources provided -He was encouraged to follow-up for worsened symptoms

## 2020-08-12 NOTE — Assessment & Plan Note (Signed)
Last CMP at an emergency room visit noted mildly elevated liver enzymes.  He feels well today and we will repeat a CMP to ensure his have returned to normal. -Follow-up CMP

## 2020-08-13 LAB — COMPREHENSIVE METABOLIC PANEL
ALT: 68 IU/L — ABNORMAL HIGH (ref 0–44)
AST: 91 IU/L — ABNORMAL HIGH (ref 0–40)
Albumin/Globulin Ratio: 1.9 (ref 1.2–2.2)
Albumin: 4.7 g/dL (ref 4.1–5.2)
Alkaline Phosphatase: 94 IU/L (ref 44–121)
BUN/Creatinine Ratio: 16 (ref 9–20)
BUN: 15 mg/dL (ref 6–20)
Bilirubin Total: 0.4 mg/dL (ref 0.0–1.2)
CO2: 24 mmol/L (ref 20–29)
Calcium: 9.8 mg/dL (ref 8.7–10.2)
Chloride: 106 mmol/L (ref 96–106)
Creatinine, Ser: 0.92 mg/dL (ref 0.76–1.27)
GFR calc Af Amer: 133 mL/min/{1.73_m2} (ref >59)
GFR calc non Af Amer: 115 mL/min/{1.73_m2} (ref >59)
Globulin, Total: 2.5 g/dL (ref 1.5–4.5)
Glucose: 107 mg/dL — ABNORMAL HIGH (ref 65–99)
Potassium: 4 mmol/L (ref 3.5–5.2)
Sodium: 145 mmol/L — ABNORMAL HIGH (ref 134–144)
Total Protein: 7.2 g/dL (ref 6.0–8.5)

## 2020-08-14 ENCOUNTER — Other Ambulatory Visit: Payer: Self-pay | Admitting: Family Medicine

## 2020-08-14 DIAGNOSIS — R748 Abnormal levels of other serum enzymes: Secondary | ICD-10-CM

## 2020-08-14 NOTE — Progress Notes (Signed)
Spoke with April at [facility]. Made [facility] appt for Jan 20th, 2022 at 8:40am. Pt is to fast for this imaging. Nothing to eat or drink after midnight before appt. Pt can take medication before appt if need be, but to only drink a sip of water to take them cant have at lot of liquid for this scan. Aquilla Solian, CMA

## 2020-08-14 NOTE — Progress Notes (Signed)
Spoke with pt informed of appt date.pt understood.  I will also send an appt card in the mail. Aquilla Solian, CMA

## 2020-08-14 NOTE — Progress Notes (Unsigned)
Donald Mcintosh was called and informed that his liver enzymes remain mildly elevated.  He specifically denied any alcohol use and notes that his last drink was many months ago.  He also reports that he currently takes no over-the-counter medication.  He believes that he had all of the routine immunizations as a child and does not remember there being an issue with refusing vaccines.  He was informed that we will do a little bit more blood work and have an ultrasound ordered to take a look at his liver.  Mirian Mo, MD

## 2020-08-19 ENCOUNTER — Other Ambulatory Visit: Payer: Self-pay

## 2020-08-19 ENCOUNTER — Other Ambulatory Visit: Payer: Medicaid Other

## 2020-08-20 ENCOUNTER — Ambulatory Visit
Admission: RE | Admit: 2020-08-20 | Discharge: 2020-08-20 | Disposition: A | Discharge status: Home / Self Care | Payer: Medicaid Other | Source: Ambulatory Visit | Attending: Family Medicine | Admitting: Family Medicine

## 2020-08-20 DIAGNOSIS — R748 Abnormal levels of other serum enzymes: Secondary | ICD-10-CM

## 2020-08-20 LAB — ACUTE HEP PANEL AND HEP B SURFACE AB
Hep A IgM: NEGATIVE
Hep B C IgM: NEGATIVE
Hep C Virus Ab: <0.1 s/co ratio (ref 0.0–0.9)
Hepatitis B Surf Ab Quant: 3.2 m[IU]/mL — ABNORMAL LOW (ref >9.9)
Hepatitis B Surface Ag: NEGATIVE

## 2020-08-20 LAB — HIV ANTIBODY (ROUTINE TESTING W REFLEX): HIV Screen 4th Generation wRfx: NONREACTIVE

## 2020-09-03 ENCOUNTER — Ambulatory Visit: Payer: Medicaid Other | Attending: Family Medicine | Admitting: Physical Therapy

## 2020-09-11 ENCOUNTER — Encounter (HOSPITAL_COMMUNITY): Payer: Self-pay | Admitting: Emergency Medicine

## 2020-09-11 ENCOUNTER — Other Ambulatory Visit: Payer: Self-pay

## 2020-09-11 ENCOUNTER — Ambulatory Visit (HOSPITAL_COMMUNITY)
Admission: EM | Admit: 2020-09-11 | Discharge: 2020-09-11 | Disposition: A | Discharge status: Home / Self Care | Payer: Medicaid Other | Attending: Medical Oncology | Admitting: Medical Oncology

## 2020-09-11 DIAGNOSIS — M62830 Muscle spasm of back: Secondary | ICD-10-CM

## 2020-09-11 MED ORDER — CYCLOBENZAPRINE HCL 10 MG PO TABS: 10.0000 mg | ORAL_TABLET | Freq: Two times a day (BID) | ORAL | 0 refills | Status: DC | PRN

## 2020-09-11 MED ORDER — KETOROLAC TROMETHAMINE 30 MG/ML IJ SOLN
INTRAMUSCULAR | Status: AC
  Filled 2020-09-11: qty 1

## 2020-09-11 MED ORDER — KETOROLAC TROMETHAMINE 30 MG/ML IJ SOLN
30.0000 mg | Freq: Once | INTRAMUSCULAR | Status: AC
  Administered 2020-09-11: 30 mg via INTRAMUSCULAR

## 2020-09-11 NOTE — ED Provider Notes (Signed)
[facility]    CSN: [id number] Arrival date & time: 09/11/20  1435      History   Chief Complaint Chief Complaint  Patient presents with  . Back Pain    HPI Donald Mcintosh is a 25 y.o. male.   HPI   Back Pain: Pt reports that today he started having muscle spasms of his back. 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 No known injury. He does lift heavy boxes normally at work. He reports that his spasms occur with movements of his back. No midline pain, loss of sensation of groin, incontinence, leg weakness.  No history of back injury. He has taken a tylenol around 10 am which did not help symptoms.   Past Medical History:  Diagnosis Date  . Asthma     Patient Active Problem List   Diagnosis Date Noted  . Toe pain 08/12/2020  . Elevated liver enzymes 08/12/2020  . Tinea pedis 09/18/2019  . Bipolar disorder (HCC) 09/18/2019  . History of ADHD 09/18/2019  . Unwanted fertility 09/18/2019  . Current smoker 09/18/2019    History reviewed. No pertinent surgical history.   Home Medications    Prior to Admission medications   Medication Sig Start Date End Date Taking? Authorizing Provider  clotrimazole (LOTRIMIN) 1 % cream Apply 1 application topically 2 (two) times daily. 09/18/19   Mirian Mo, MD  methocarbamol (ROBAXIN) 500 MG tablet Take 1 tablet (500 mg total) by mouth 2 (two) times daily. Patient not taking: No sig reported 07/05/15   Garlon Hatchet, PA-C  naproxen (NAPROSYN) 500 MG tablet Take 1 tablet (500 mg total) by mouth 2 (two) times daily with a meal. Patient not taking: No sig reported 07/05/15   Garlon Hatchet, PA-C  traMADol (ULTRAM) 50 MG tablet Take 1 tablet (50 mg total) by mouth every 6 (six) hours as needed. Patient not taking: No sig reported 02/03/14   Renne Crigler, PA-C    Family History Family History  Problem Relation Age of Onset  . Hypertension Father   . Cancer Maternal Great-grandmother   . Stroke Cousin    . Kidney disease Cousin     Social History Social History   Tobacco Use  . Smoking status: Current Every Day Smoker    Types: Cigarettes  . Smokeless tobacco: Never Used  Substance Use Topics  . Alcohol use: No  . Drug use: No     Allergies   Patient has no known allergies.   Review of Systems Review of Systems  As stated above in HPI Physical Exam Triage Vital Signs ED Triage Vitals  Enc Vitals Group     BP 09/11/20 1542 140/90     Pulse Rate 09/11/20 1542 73     Resp 09/11/20 1542 18     Temp 09/11/20 1542 98.3 F (36.8 C)     Temp Source 09/11/20 1542 Oral     SpO2 09/11/20 1542 99 %     Weight --      Height --      Head Circumference --      Peak Flow --      Pain Score 09/11/20 1543 10     Pain Loc --      Pain Edu? --      Excl. in GC? --    No data found.  Updated Vital Signs BP 140/90 (BP Location: Left Arm)   Pulse 73   Temp 98.3 F (36.8 C) (  Oral)   Resp 18   SpO2 99%   Physical Exam Vitals and nursing note reviewed.  Musculoskeletal:        General: Normal range of motion.     Cervical back: Normal. No bony tenderness.     Thoracic back: Spasms and tenderness present. No bony tenderness.     Lumbar back: Normal. No bony tenderness.       Back:  Neurological:     Mental Status: He is alert.     Sensory: No sensory deficit.     Motor: No weakness.     Coordination: Coordination normal.     Gait: Gait normal.     Deep Tendon Reflexes: Reflexes normal.      UC Treatments / Results  Labs (all labs ordered are listed, but only abnormal results are displayed) Labs Reviewed - No data to display  EKG   Radiology No results found.  Procedures Procedures (including critical care time)  Medications Ordered in UC Medications - No data to display  Initial Impression / Assessment and Plan / UC Course  I have reviewed the triage vital signs and the nursing notes.  Pertinent labs & imaging results that were available during my  care of the patient were reviewed by me and considered in my medical decision making (see chart for details).     New. Discussed muscle spasms. 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 Discussed how to take along with common potential side effects and precautions. Weight lifting precautions discussed. Red flag symptoms discussed.   Final Clinical Impressions(s) / UC Diagnoses   Final diagnoses:  None   Discharge Instructions   None    ED Prescriptions    None     PDMP not reviewed this encounter.   Rushie Chestnut, [place] 09/11/20 1625

## 2020-09-11 NOTE — ED Triage Notes (Signed)
Pt states that this morning the right side of his back locked up and it is painful to move. Pt states that he does lift boxes at work and started working out more frequently outside of work and may think this may have flared up his back spasm

## 2020-09-11 NOTE — ED Notes (Signed)
Pt called in lobby, no response 

## 2020-09-15 ENCOUNTER — Telehealth (HOSPITAL_COMMUNITY): Payer: Self-pay | Admitting: Medical Oncology

## 2020-09-15 NOTE — Telephone Encounter (Signed)
Work note created.

## 2020-09-29 ENCOUNTER — Encounter (HOSPITAL_COMMUNITY): Payer: Self-pay | Admitting: Emergency Medicine

## 2020-09-29 ENCOUNTER — Ambulatory Visit (HOSPITAL_COMMUNITY)
Admission: EM | Admit: 2020-09-29 | Discharge: 2020-09-29 | Disposition: A | Discharge status: Home / Self Care | Payer: Medicaid Other | Attending: Student | Admitting: Student

## 2020-09-29 ENCOUNTER — Other Ambulatory Visit: Payer: Self-pay

## 2020-09-29 DIAGNOSIS — S29012A Strain of muscle and tendon of back wall of thorax, initial encounter: Secondary | ICD-10-CM

## 2020-09-29 MED ORDER — TIZANIDINE HCL 2 MG PO CAPS: 2.0000 mg | ORAL_CAPSULE | Freq: Three times a day (TID) | ORAL | 0 refills | Status: DC

## 2020-09-29 MED ORDER — PREDNISONE 20 MG PO TABS: 40.0000 mg | ORAL_TABLET | Freq: Every day | ORAL | 0 refills | Status: AC

## 2020-09-29 NOTE — ED Triage Notes (Addendum)
Patient c/o RT sided back pain/ "muscle spasms" x 2 weeks.   Patient endorses that pain comes on "randomly".   Patient was seen in clinic on 2/11, prescribed a "muscle relaxant for muscle spasms". Patient states they medication regimen had a minimal effect on symptoms.   Patient is ambulatory upon arrival today.

## 2020-09-29 NOTE — ED Provider Notes (Signed)
[facility]    CSN: [id number] Arrival date & time: 09/29/20  1728      History   Chief Complaint Chief Complaint  Patient presents with  . Spasms    HPI Donald Mcintosh is a 25 y.o. male presenting for muscle spasms x2 weeks, which he states are poorly controlled on Flexeril.  He states that today he is experiencing right-sided back pain and muscle spasms with movement.  Denies known injury, but states that he does lift heavy boxes normally at work.  States that he does not have any pain at rest, but experiences spasms with movement.Denies pain shooting down legs, denies numbness in arms/legs, denies weakness in arms/legs, denies saddle anesthesia, denies bowel/bladder incontinence. States he is here today because he requires clearance for work.     HPI  Past Medical History:  Diagnosis Date  . Asthma     Patient Active Problem List   Diagnosis Date Noted  . Toe pain 08/12/2020  . Elevated liver enzymes 08/12/2020  . Tinea pedis 09/18/2019  . Bipolar disorder (HCC) 09/18/2019  . History of ADHD 09/18/2019  . Unwanted fertility 09/18/2019  . Current smoker 09/18/2019    History reviewed. No pertinent surgical history.     Home Medications    Prior to Admission medications   Medication Sig Start Date End Date Taking? Authorizing Provider  predniSONE (DELTASONE) 20 MG tablet Take 2 tablets (40 mg total) by mouth daily for 5 days. 09/29/20 10/04/20 Yes Rhys Martini, PA-C  tizanidine (ZANAFLEX) 2 MG capsule Take 1 capsule (2 mg total) by mouth 3 (three) times daily. 09/29/20  Yes Rhys Martini, PA-C    Family History Family History  Problem Relation Age of Onset  . Hypertension Father   . Cancer Maternal Great-grandmother   . Stroke Cousin   . Kidney disease Cousin     Social History Social History   Tobacco Use  . Smoking status: Current Every Day Smoker    Types: Cigarettes  . Smokeless tobacco: Never Used  Substance Use Topics  . Alcohol  use: No  . Drug use: No     Allergies   Patient has no known allergies.   Review of Systems Review of Systems  Musculoskeletal: Positive for back pain.  All other systems reviewed and are negative.    Physical Exam Triage Vital Signs ED Triage Vitals  Enc Vitals Group     BP 09/29/20 1744 135/88     Pulse Rate 09/29/20 1744 86     Resp 09/29/20 1744 13     Temp 09/29/20 1744 98.2 F (36.8 C)     Temp Source 09/29/20 1744 Oral     SpO2 09/29/20 1744 99 %     Weight --      Height --      Head Circumference --      Peak Flow --      Pain Score 09/29/20 1741 0     Pain Loc --      Pain Edu? --      Excl. in GC? --    No data found.  Updated Vital Signs BP 135/88 (BP Location: Right Arm)   Pulse 86   Temp 98.2 F (36.8 C) (Oral)   Resp 13   SpO2 99%   Visual Acuity Right Eye Distance:   Left Eye Distance:   Bilateral Distance:    Right Eye Near:   Left Eye Near:    Bilateral Near:  Physical Exam Vitals reviewed.  Constitutional:      General: He is not in acute distress.    Appearance: Normal appearance. He is not ill-appearing.  HENT:     Head: Normocephalic and atraumatic.  Eyes:     Extraocular Movements: Extraocular movements intact.     Pupils: Pupils are equal, round, and reactive to light.  Cardiovascular:     Rate and Rhythm: Normal rate and regular rhythm.     Heart sounds: Normal heart sounds.  Pulmonary:     Effort: Pulmonary effort is normal.     Breath sounds: Normal breath sounds and air entry.  Abdominal:     Palpations: Abdomen is soft.     Tenderness: There is no abdominal tenderness. There is no right CVA tenderness, left CVA tenderness, guarding or rebound.     Comments: No bowel or bladder incontinence.  Musculoskeletal:     Cervical back: Normal range of motion. No swelling, deformity, signs of trauma, rigidity, spasms, tenderness, bony tenderness or crepitus. No pain with movement.     Thoracic back: Spasms and  tenderness present. No swelling, deformity, signs of trauma or bony tenderness. Normal range of motion. No scoliosis.     Lumbar back: No swelling, deformity, signs of trauma, spasms, tenderness or bony tenderness. Normal range of motion. Negative right straight leg raise test and negative left straight leg raise test. No scoliosis.     Comments: Strength 5/5 in UEs and LEs. Right-sided thoracic paraspinous muscle tenderness with palpation. Elicited with rotation of thoracic spine to the right. No pain with flexion/extension spine. Negative straight leg raise bilaterally.  Neurological:     General: No focal deficit present.     Mental Status: He is alert.     Cranial Nerves: No cranial nerve deficit.     Comments: Strength 5/5 in UEs and LEs. Gait normal. Sensation intact in UEs and LEs.   Psychiatric:        Mood and Affect: Mood normal.        Behavior: Behavior normal.        Thought Content: Thought content normal.        Judgment: Judgment normal.      UC Treatments / Results  Labs (all labs ordered are listed, but only abnormal results are displayed) Labs Reviewed - No data to display  EKG   Radiology No results found.  Procedures Procedures (including critical care time)  Medications Ordered in UC Medications - No data to display  Initial Impression / Assessment and Plan / UC Course  I have reviewed the triage vital signs and the nursing notes.  Pertinent labs & imaging results that were available during my care of the patient were reviewed by me and considered in my medical decision making (see chart for details).       This patient is a 25 year old male presenting with continued right-sided lumbar paraspinous muscle spasms, poorly controlled on Flexeril.  Denies known injury. Presenting today because he requires clearance for work.  Plan to stop flexeril and treat with zanaflex and prednisone. He does not have diabetes.   Discussed that I cannot fully clear  him today but I did provide clearance for lifting under 10 pounds. Return to [place] or occupational health for full clearance.  Red flag symptoms discussed.  This chart was dictated using voice recognition software, Dragon. Despite the best efforts of this provider to proofread and correct errors, errors may still occur which can change documentation meaning.  Final Clinical Impressions(s) / UC Diagnoses   Final diagnoses:  Strain of thoracic back region     Discharge Instructions     -Start the new muscle relaxer-Zanaflex (tizanidine).  You can take this up to 3 times daily as needed for muscular strain and muscle spasms.  This can make you drowsy, so take in the evening or when you don't have to drive.  Stop the old muscle relaxer-Flexeril. -Also start the steroid-prednisone, 2 pills taken together in the morning for 5 days.  This can give you energy, so do take in the morning. -Seek additional medical treatment if you experience worsening of pain, sensation changes in arms or legs, weakness in arms or legs, pain radiating down arms or legs, bowel or bladder dysfunction.    ED Prescriptions    Medication Sig Dispense Auth. Provider   predniSONE (DELTASONE) 20 MG tablet Take 2 tablets (40 mg total) by mouth daily for 5 days. 10 tablet Rhys Martini, PA-C   tizanidine (ZANAFLEX) 2 MG capsule Take 1 capsule (2 mg total) by mouth 3 (three) times daily. 21 capsule Rhys Martini, PA-C     I have reviewed the PDMP during this encounter.   Rhys Martini, PA-C 09/29/20 1823

## 2020-09-29 NOTE — Discharge Instructions (Signed)
-  Start the new muscle relaxer-Zanaflex (tizanidine).  You can take this up to 3 times daily as needed for muscular strain and muscle spasms.  This can make you drowsy, so take in the evening or when you don't have to drive.  Stop the old muscle relaxer-Flexeril. -Also start the steroid-prednisone, 2 pills taken together in the morning for 5 days.  This can give you energy, so do take in the morning. -Seek additional medical treatment if you experience worsening of pain, sensation changes in arms or legs, weakness in arms or legs, pain radiating down arms or legs, bowel or bladder dysfunction.

## 2020-10-05 ENCOUNTER — Other Ambulatory Visit: Payer: Self-pay

## 2020-10-05 ENCOUNTER — Encounter: Payer: Self-pay | Admitting: Family Medicine

## 2020-10-05 ENCOUNTER — Ambulatory Visit (INDEPENDENT_AMBULATORY_CARE_PROVIDER_SITE_OTHER): Payer: Self-pay | Admitting: Family Medicine

## 2020-10-05 DIAGNOSIS — M6282 Rhabdomyolysis: Secondary | ICD-10-CM

## 2020-10-05 DIAGNOSIS — M546 Pain in thoracic spine: Secondary | ICD-10-CM

## 2020-10-05 DIAGNOSIS — R748 Abnormal levels of other serum enzymes: Secondary | ICD-10-CM

## 2020-10-05 MED ORDER — TIZANIDINE HCL 2 MG PO CAPS: 2.0000 mg | ORAL_CAPSULE | Freq: Three times a day (TID) | ORAL | 3 refills | Status: DC

## 2020-10-05 NOTE — Patient Instructions (Signed)
I will call with lab test results I hope the stuff I filled out gets you back to work. I refilled your muscle relaxer. Unless there is some surprise on the blood work, I trust this is only a temporary. Come back if you continue to have symptoms beyond a month.

## 2020-10-06 ENCOUNTER — Encounter (HOSPITAL_COMMUNITY): Payer: Self-pay | Admitting: *Deleted

## 2020-10-06 ENCOUNTER — Encounter: Payer: Self-pay | Admitting: Family Medicine

## 2020-10-06 ENCOUNTER — Ambulatory Visit (HOSPITAL_COMMUNITY): Admission: EM | Admit: 2020-10-06 | Discharge: 2020-10-06 | Payer: Medicaid Other

## 2020-10-06 ENCOUNTER — Other Ambulatory Visit: Payer: Self-pay

## 2020-10-06 DIAGNOSIS — M6282 Rhabdomyolysis: Secondary | ICD-10-CM | POA: Insufficient documentation

## 2020-10-06 DIAGNOSIS — R748 Abnormal levels of other serum enzymes: Secondary | ICD-10-CM

## 2020-10-06 DIAGNOSIS — R531 Weakness: Secondary | ICD-10-CM

## 2020-10-06 LAB — SEDIMENTATION RATE: Sed Rate: 17 mm/hr — ABNORMAL HIGH (ref 0–15)

## 2020-10-06 LAB — CMP14+EGFR
ALT: 50 IU/L — ABNORMAL HIGH (ref 0–44)
AST: 48 IU/L — ABNORMAL HIGH (ref 0–40)
Albumin/Globulin Ratio: 1.6 (ref 1.2–2.2)
Albumin: 4.4 g/dL (ref 4.1–5.2)
Alkaline Phosphatase: 85 IU/L (ref 44–121)
BUN/Creatinine Ratio: 23 — ABNORMAL HIGH (ref 9–20)
BUN: 22 mg/dL — ABNORMAL HIGH (ref 6–20)
Bilirubin Total: <0.2 mg/dL (ref 0.0–1.2)
CO2: 17 mmol/L — ABNORMAL LOW (ref 20–29)
Calcium: 9.1 mg/dL (ref 8.7–10.2)
Chloride: 103 mmol/L (ref 96–106)
Creatinine, Ser: 0.97 mg/dL (ref 0.76–1.27)
Globulin, Total: 2.8 g/dL (ref 1.5–4.5)
Glucose: 112 mg/dL — ABNORMAL HIGH (ref 65–99)
Potassium: 4 mmol/L (ref 3.5–5.2)
Sodium: 137 mmol/L (ref 134–144)
Total Protein: 7.2 g/dL (ref 6.0–8.5)
eGFR: 110 mL/min/{1.73_m2} (ref >59)

## 2020-10-06 LAB — CK: Total CK: 1569 U/L (ref 49–439)

## 2020-10-06 NOTE — Assessment & Plan Note (Signed)
Unusual pain distribution.  That's why I checked labs.

## 2020-10-06 NOTE — Assessment & Plan Note (Signed)
Recheck labs are improved but still not normal.

## 2020-10-06 NOTE — Discharge Instructions (Signed)
Please report to the emergency room now as your labs confirm that you muscle pains are due to rhabdomyolysis. This requires a higher level of care than we can provide in the urgent care setting but can be further evaluated and managed in the emergency room.

## 2020-10-06 NOTE — Assessment & Plan Note (Signed)
I am surprised by sig elevation of CK.  Called and again confirmed no trauma.  No prescription meds causing.  He does take a "small brown pill" pre workout.  Unsure of ingrediants.  Told him to stop.  Needs follow up.  Is this medication or overuse?  Or is it inflamatory such as polymyositis?  FU one week.

## 2020-10-06 NOTE — ED Notes (Signed)
Patient is being discharged from the Urgent Care and sent to the Emergency Department via personal vehicle . Per provider Wallis Bamberg, patient is in need of higher level of care due to elevated CK Level. Patient is aware and verbalizes understanding of plan of care.   Vitals:   10/06/20 1622  BP: (!) 143/88  Pulse: 67  Resp: 18  Temp: 98.5 F (36.9 C)  SpO2: 97%

## 2020-10-06 NOTE — ED Triage Notes (Signed)
PT present today with bil. Leg spasms.

## 2020-10-06 NOTE — ED Provider Notes (Addendum)
[place]   MRN: [id number] DOB: [birth year]  Subjective:   Donald Mcintosh is a 25 y.o. male presenting for ongoing muscle spasms for the past 2 weeks.  Has consistently been hurting along his back and feeling tired.  He does a lot of strenuous work activities, does a lot of heavy lifting.  He also works out regularly.  He takes vitamin B-12 supplements, nugenix.  Reports that he went to work for his last shift and ended up having some pains there, feeling spasms in his leg.  He tried to sleep it off within this morning when he tried to get up felt severe pain in his legs where he ended up collapsing without syncope.  He has been trying to rest and hydrate today.  Of note, he did have lab work done yesterday including a CK level.  Lab results as shown below.  No current facility-administered medications for this encounter.  Current Outpatient Medications:  .  tizanidine (ZANAFLEX) 2 MG capsule, Take 1 capsule (2 mg total) by mouth 3 (three) times daily., Disp: 30 capsule, Rfl: 3   No Known Allergies  Past Medical History:  Diagnosis Date  . Asthma      No past surgical history on file.  Family History  Problem Relation Age of Onset  . Hypertension Father   . Cancer Maternal Great-grandmother   . Stroke Cousin   . Kidney disease Cousin     Social History   Tobacco Use  . Smoking status: Current Every Day Smoker    Types: Cigarettes  . Smokeless tobacco: Never Used  . Tobacco comment: pt stated that he quit yesterday  Substance Use Topics  . Alcohol use: No  . Drug use: No    ROS   Objective:   Vitals: BP (!) 143/88 (BP Location: Left Arm)   Pulse 67   Temp 98.5 F (36.9 C) (Oral)   Resp 18   SpO2 97%   Physical Exam Constitutional:      General: He is not in acute distress.    Appearance: Normal appearance. He is well-developed and normal weight. He is not ill-appearing, toxic-appearing or diaphoretic.  HENT:     Head: Normocephalic  and atraumatic.     Right Ear: External ear normal.     Left Ear: External ear normal.     Nose: Nose normal.     Mouth/Throat:     Pharynx: Oropharynx is clear.  Eyes:     General: No scleral icterus.       Right eye: No discharge.        Left eye: No discharge.     Extraocular Movements: Extraocular movements intact.     Pupils: Pupils are equal, round, and reactive to light.  Cardiovascular:     Rate and Rhythm: Normal rate.  Pulmonary:     Effort: Pulmonary effort is normal.  Musculoskeletal:     Cervical back: Normal range of motion. Spasms (along trapezius) and tenderness (along trapezius) present. No swelling, edema, deformity, erythema, signs of trauma, lacerations, rigidity, torticollis, bony tenderness or crepitus. No pain with movement. Normal range of motion.     Thoracic back: No swelling, edema, deformity, signs of trauma, lacerations, spasms, tenderness or bony tenderness. Normal range of motion. No scoliosis.     Lumbar back: No swelling, edema, deformity, signs of trauma, lacerations, spasms, tenderness or bony tenderness. Normal range of motion. Negative right straight leg raise test and negative left straight leg  raise test. No scoliosis.  Skin:    General: Skin is warm and dry.  Neurological:     Mental Status: He is alert and oriented to person, place, and time.     Motor: No weakness.     Coordination: Coordination abnormal (moving gingerly favoring lower legs).     Gait: Gait normal.     Deep Tendon Reflexes: Reflexes normal.  Psychiatric:        Mood and Affect: Mood normal.        Behavior: Behavior normal.        Thought Content: Thought content normal.        Judgment: Judgment normal.    Results for orders placed or performed in visit on 10/05/20 (from the past 48 hour(s))  CMP14+EGFR     Status: Abnormal   Collection Time: 10/05/20 11:27 AM  Result Value Ref Range   Glucose 112 (H) 65 - 99 mg/dL   BUN 22 (H) 6 - 20 mg/dL   Creatinine, Ser 0.97  0.76 - 1.27 mg/dL   eGFR 110 >59 mL/min/1.73    Comment: **In accordance with recommendations from the NKF-ASN Task force,**   [facility] has updated its eGFR calculation to the 2021 CKD-EPI   creatinine equation that estimates kidney function without a race   variable.    BUN/Creatinine Ratio 23 (H) 9 - 20   Sodium 137 134 - 144 mmol/L   Potassium 4.0 3.5 - 5.2 mmol/L   Chloride 103 96 - 106 mmol/L   CO2 17 (L) 20 - 29 mmol/L   Calcium 9.1 8.7 - 10.2 mg/dL   Total Protein 7.2 6.0 - 8.5 g/dL   Albumin 4.4 4.1 - 5.2 g/dL   Globulin, Total 2.8 1.5 - 4.5 g/dL   Albumin/Globulin Ratio 1.6 1.2 - 2.2   Bilirubin Total <0.2 0.0 - 1.2 mg/dL   Alkaline Phosphatase 85 44 - 121 IU/L   AST 48 (H) 0 - 40 IU/L   ALT 50 (H) 0 - 44 IU/L  CK     Status: Abnormal   Collection Time: 10/05/20 11:27 AM  Result Value Ref Range   Total CK 1,569 (HH) 49 - 439 U/L  Sedimentation Rate     Status: Abnormal   Collection Time: 10/05/20 11:27 AM  Result Value Ref Range   Sed Rate 17 (H) 0 - 15 mm/hr      Assessment and Plan :   PDMP not reviewed this encounter.  1. Non-traumatic rhabdomyolysis   2. Elevated CK   3. Weakness     Patient is a 25 y/o male with non-contributory pmh presenting for 2 week history of persistent spasms, shown to have an elevated CK level. Concern is for rhabdomyolysis due persistent muscle aches and pains, lower leg pain on exam. Previously his PCP was concerned for this as well as possibly polymyositis.  Recommended further evaluation and intervention in the emergency room including IV fluids, repeat labs.  Patient is agreeable and will present there now.    Jaynee Eagles, [place] 10/06/20 1645

## 2020-10-06 NOTE — Progress Notes (Signed)
    SUBJECTIVE:   CHIEF COMPLAINT / HPI:    Patient with significant upper thoracic and bilateral neck and arm pain Right > Left x 3 weeks.  Seen at urgent care x 2 - no x rays or labs.  Told muscle spasm and prescribed muscle relaxer.  He is improving (85% better)and needs note to return to work including restrictions.  Works at [employer] with job requiring ability to lift 80 lbs.  Normally not a problem for him.  He is very fit and muscular.  Works out regularly including [employer]  No trauma    OBJECTIVE:   BP 118/86   Pulse 86   Ht 5\' 11"  (1.803 m)   Wt 240 lb 3.2 oz (109 kg)   SpO2 97%   BMI 33.50 kg/m   Pain distribution starts in the infrascapular area, rises to the neck and extends down both arms to the elbows.  No noted swelling or joint pain.  ASSESSMENT/PLAN:   Thoracic back pain Unusual pain distribution.  That's why I checked labs.    Elevated liver enzymes Recheck labs are improved but still not normal.  Rhabdomyolysis I am surprised by sig elevation of CK.  Called and again confirmed no trauma.  No prescription meds causing.  He does take a "small brown pill" pre workout.  Unsure of ingrediants.  Told him to stop.  Needs follow up.  Is this medication or overuse?  Or is it inflamatory such as polymyositis?  FU one week.     , MD [facility]

## 2020-10-07 ENCOUNTER — Other Ambulatory Visit: Payer: Self-pay

## 2020-10-07 ENCOUNTER — Emergency Department (HOSPITAL_COMMUNITY)
Admission: EM | Admit: 2020-10-07 | Discharge: 2020-10-08 | Disposition: A | Discharge status: Home / Self Care | Payer: Medicaid Other | Attending: Emergency Medicine | Admitting: Emergency Medicine

## 2020-10-07 DIAGNOSIS — Z5321 Procedure and treatment not carried out due to patient leaving prior to being seen by health care provider: Secondary | ICD-10-CM | POA: Insufficient documentation

## 2020-10-07 DIAGNOSIS — R748 Abnormal levels of other serum enzymes: Secondary | ICD-10-CM | POA: Insufficient documentation

## 2020-10-07 LAB — CBC
HCT: 43.5 % (ref 39.0–52.0)
Hemoglobin: 14.6 g/dL (ref 13.0–17.0)
MCH: 31.3 pg (ref 26.0–34.0)
MCHC: 33.6 g/dL (ref 30.0–36.0)
MCV: 93.3 fL (ref 80.0–100.0)
Platelets: 282 10*3/uL (ref 150–400)
RBC: 4.66 MIL/uL (ref 4.22–5.81)
RDW: 12.6 % (ref 11.5–15.5)
WBC: 8.7 10*3/uL (ref 4.0–10.5)
nRBC: 0 % (ref 0.0–0.2)

## 2020-10-07 LAB — BASIC METABOLIC PANEL
Anion gap: 8 (ref 5–15)
BUN: 21 mg/dL — ABNORMAL HIGH (ref 6–20)
CO2: 27 mmol/L (ref 22–32)
Calcium: 9.3 mg/dL (ref 8.9–10.3)
Chloride: 98 mmol/L (ref 98–111)
Creatinine, Ser: 1.07 mg/dL (ref 0.61–1.24)
GFR, Estimated: >60 mL/min (ref >60)
Glucose, Bld: 94 mg/dL (ref 70–99)
Potassium: 3.9 mmol/L (ref 3.5–5.1)
Sodium: 133 mmol/L — ABNORMAL LOW (ref 135–145)

## 2020-10-07 LAB — CK: Total CK: 878 U/L — ABNORMAL HIGH (ref 49–397)

## 2020-10-07 NOTE — ED Triage Notes (Signed)
Was sent by his provider due to elevated CK yesterday. Pt states that he feels better. 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

## 2020-10-08 ENCOUNTER — Encounter (HOSPITAL_COMMUNITY): Payer: Self-pay | Admitting: Pharmacy Technician

## 2020-10-08 ENCOUNTER — Emergency Department (HOSPITAL_COMMUNITY)
Admission: EM | Admit: 2020-10-08 | Discharge: 2020-10-08 | Disposition: A | Discharge status: Home / Self Care | Payer: Self-pay | Attending: Emergency Medicine | Admitting: Emergency Medicine

## 2020-10-08 DIAGNOSIS — M6282 Rhabdomyolysis: Secondary | ICD-10-CM | POA: Insufficient documentation

## 2020-10-08 DIAGNOSIS — F17210 Nicotine dependence, cigarettes, uncomplicated: Secondary | ICD-10-CM | POA: Insufficient documentation

## 2020-10-08 DIAGNOSIS — R519 Headache, unspecified: Secondary | ICD-10-CM | POA: Insufficient documentation

## 2020-10-08 DIAGNOSIS — J45909 Unspecified asthma, uncomplicated: Secondary | ICD-10-CM | POA: Insufficient documentation

## 2020-10-08 DIAGNOSIS — R5383 Other fatigue: Secondary | ICD-10-CM | POA: Insufficient documentation

## 2020-10-08 LAB — COMPREHENSIVE METABOLIC PANEL
ALT: 50 U/L — ABNORMAL HIGH (ref 0–44)
AST: 38 U/L (ref 15–41)
Albumin: 4.4 g/dL (ref 3.5–5.0)
Alkaline Phosphatase: 75 U/L (ref 38–126)
Anion gap: 7 (ref 5–15)
BUN: 22 mg/dL — ABNORMAL HIGH (ref 6–20)
CO2: 26 mmol/L (ref 22–32)
Calcium: 9.9 mg/dL (ref 8.9–10.3)
Chloride: 102 mmol/L (ref 98–111)
Creatinine, Ser: 1.07 mg/dL (ref 0.61–1.24)
GFR, Estimated: >60 mL/min (ref >60)
Glucose, Bld: 116 mg/dL — ABNORMAL HIGH (ref 70–99)
Potassium: 4.8 mmol/L (ref 3.5–5.1)
Sodium: 135 mmol/L (ref 135–145)
Total Bilirubin: 0.6 mg/dL (ref 0.3–1.2)
Total Protein: 7.7 g/dL (ref 6.5–8.1)

## 2020-10-08 LAB — CBC
HCT: 46.7 % (ref 39.0–52.0)
Hemoglobin: 15.9 g/dL (ref 13.0–17.0)
MCH: 31.6 pg (ref 26.0–34.0)
MCHC: 34 g/dL (ref 30.0–36.0)
MCV: 92.8 fL (ref 80.0–100.0)
Platelets: 282 10*3/uL (ref 150–400)
RBC: 5.03 MIL/uL (ref 4.22–5.81)
RDW: 12.8 % (ref 11.5–15.5)
WBC: 7.6 10*3/uL (ref 4.0–10.5)
nRBC: 0 % (ref 0.0–0.2)

## 2020-10-08 LAB — CK: Total CK: 841 U/L — ABNORMAL HIGH (ref 49–397)

## 2020-10-08 MED ORDER — SODIUM CHLORIDE 0.9 % IV BOLUS
1000.0000 mL | Freq: Once | INTRAVENOUS | Status: AC
  Administered 2020-10-08: 1000 mL via INTRAVENOUS

## 2020-10-08 NOTE — ED Notes (Signed)
Called vitals x1

## 2020-10-08 NOTE — ED Triage Notes (Signed)
Pt here POV with reports of elevated CK levels. Came yesterday but left prior to being seen by a provider. Pt c/o headache.

## 2020-10-08 NOTE — Discharge Instructions (Signed)
You came to the emergency department today to be evaluated for your elevated creatinine kinase (CK) levels.  She has been told by her primary care provider your CK level is elevated which represents that you have rhabdomyolysis.  This condition is caused by muscle damage.  Your CK level has been improving since first checked by your primary care provider.  Today you received a 1 L fluid bolus to help dehydration and reduced your CK levels.  It is important that at home you increase your water intake drinking at least 1 L of water a day.  Please follow-up with your primary care provider within the week to be reevaluated and have your lab work rechecked.  Get help right away if you: Have a seizure. Bleed easily or cannot control bleeding. Cannot urinate. Have chest pain. Have trouble breathing.

## 2020-10-08 NOTE — ED Notes (Signed)
vrbo for labs given from MD Tegeler

## 2020-10-08 NOTE — ED Provider Notes (Signed)
[facility] EMERGENCY DEPARTMENT Provider Note   CSN: [id number] Arrival date & time: 10/08/20  1106     History Chief Complaint  Patient presents with  . Abnormal Lab    Donald Mcintosh is a 25 y.o. male history of asthma and bipolar disorder.   Patient presents with a chief complaint of abnormal results.  Per chart review patient had elevated CK 1569 on 3/7.  Lab work obtained yesterday showed CK 878.    Was seen by his primary care provider on 10/05/2020.  Until that he had elevated CK lab.  He reports that he was seen at urgent care on 3/8 for the same complaints as well as collapsing due to leg pain.    Patient reports that he has had ongoing muscle spasms over the last 2 weeks as well as feeling general fatigue.  Reports that he he works in a [employer] which is physically strenuous.  Also endorses lifting weights however states he has not done this in the last week.  Patient reports taking vitamin B12 supplements and Nugenix reports he has not taken it since seeing his primary care provider on 3/7.    At present patient denies any muscle pain, spasms or aches.  He complains of headache which she has had since yesterday.  Patient reports that headache was gradual in onset, pain has been constant.  Patient rates his pain 2/10 on the pain scale.    HPI     Past Medical History:  Diagnosis Date  . Asthma     Patient Active Problem List   Diagnosis Date Noted  . Rhabdomyolysis 10/06/2020  . Thoracic back pain 10/05/2020  . Toe pain 08/12/2020  . Elevated liver enzymes 08/12/2020  . Tinea pedis 09/18/2019  . Bipolar disorder (HCC) 09/18/2019  . History of ADHD 09/18/2019  . Unwanted fertility 09/18/2019  . Current smoker 09/18/2019    History reviewed. No pertinent surgical history.     Family History  Problem Relation Age of Onset  . Hypertension Father   . Cancer Maternal Great-grandmother   . Stroke Cousin   . Kidney disease Cousin      Social History   Tobacco Use  . Smoking status: Current Every Day Smoker    Types: Cigarettes  . Smokeless tobacco: Never Used  . Tobacco comment: pt stated that he quit yesterday  Substance Use Topics  . Alcohol use: No  . Drug use: No    Home Medications Prior to Admission medications   Medication Sig Start Date End Date Taking? Authorizing Provider  tizanidine (ZANAFLEX) 2 MG capsule Take 1 capsule (2 mg total) by mouth 3 (three) times daily. 10/05/20   Moses Manners, MD    Allergies    Patient has no known allergies.  Review of Systems   Review of Systems  Constitutional: Negative for chills and fever.  Eyes: Negative for visual disturbance.  Respiratory: Negative for shortness of breath.   Cardiovascular: Negative for chest pain.  Gastrointestinal: Negative for abdominal pain, nausea and vomiting.  Genitourinary: Negative for difficulty urinating, dysuria, frequency and hematuria.  Musculoskeletal: Negative for arthralgias, back pain, gait problem, joint swelling, myalgias and neck pain.  Skin: Negative for color change and rash.  Neurological: Positive for headaches. Negative for dizziness, syncope and light-headedness.  Psychiatric/Behavioral: Negative for confusion.    Physical Exam Updated Vital Signs BP (!) 150/118 (BP Location: Right Arm)   Pulse 84   Temp 98.6 F (37 C) (Oral)  Resp 18   SpO2 98%   Physical Exam Vitals and nursing note reviewed.  Constitutional:      General: He is not in acute distress.    Appearance: He is not ill-appearing, toxic-appearing or diaphoretic.  HENT:     Head: Normocephalic and atraumatic.  Eyes:     General: No scleral icterus.       Right eye: No discharge.        Left eye: No discharge.  Cardiovascular:     Rate and Rhythm: Normal rate.     Heart sounds: Normal heart sounds.  Pulmonary:     Effort: Pulmonary effort is normal. No respiratory distress.     Breath sounds: Normal breath sounds. No stridor.  No wheezing, rhonchi or rales.  Chest:     Chest wall: No tenderness.  Musculoskeletal:     Cervical back: Normal range of motion and neck supple.  Skin:    General: Skin is warm and dry.  Neurological:     General: No focal deficit present.     Mental Status: He is alert and oriented to person, place, and time.     GCS: GCS eye subscore is 4. GCS verbal subscore is 5. GCS motor subscore is 6.     Gait: Gait is intact. Gait normal.  Psychiatric:        Behavior: Behavior is cooperative.     ED Results / Procedures / Treatments   Labs (all labs ordered are listed, but only abnormal results are displayed) Labs Reviewed  COMPREHENSIVE METABOLIC PANEL - Abnormal; Notable for the following components:      Result Value   Glucose, Bld 116 (*)    BUN 22 (*)    ALT 50 (*)    All other components within normal limits  CK - Abnormal; Notable for the following components:   Total CK 841 (*)    All other components within normal limits  CBC    EKG None  Radiology No results found.  Procedures Procedures   Medications Ordered in ED Medications  sodium chloride 0.9 % bolus 1,000 mL (0 mLs Intravenous Stopped 10/08/20 1533)    ED Course  I have reviewed the triage vital signs and the nursing notes.  Pertinent labs & imaging results that were available during my care of the patient were reviewed by me and considered in my medical decision making (see chart for details).    MDM Rules/Calculators/A&P                          25 year old male no acute distress, nontoxic-appearing.  Patient presenting with chief complaint of elevated CK.  CK trending downwards with 1569 on 3/7 and 878 yesterday.  Patient was also noted to have elevated BUN yesterday at 21 creatinine of normal limits.  This likely represents dehydration.  Will give patient 1 L fluid bolus to help with his rhabdomyolysis dehydration.  Patient complains of headache at present.  Low suspicion for subarachnoid  hemorrhage as onset was gradual in onset, pain not maximal in onset, no focal neurological deficits.  Will check CBC, CMP, and CK.  Will give patient 1 L fluid bolus.  If CK continues trending downwards we will discharge patient with instructions to increase fluid intake.  Will have patient follow-up with his primary care provider.  CK is improved slightly to 841.  CMP shows BUN elevated at 22 with creatinine now within normal limits; likely represents dehydration.  CBC unremarkable.  Patient completed 1 L fluid bolus continues to deny any myalgias or muscle spasms.  Will have patient follow-up with his primary care provider for recheck of CK as well as recheck of his blood pressure as he was noted to be hypertensive in the ED.  Discussed results, findings, treatment and follow up. Patient advised of return precautions. Patient verbalized understanding and agreed with plan.  Final Clinical Impression(s) / ED Diagnoses Final diagnoses:  Non-traumatic rhabdomyolysis    Rx / DC Orders ED Discharge Orders    None       Berneice Heinrich 10/08/20 2212    Tegeler, Canary Brim, MD 10/09/20 [id number]

## 2020-10-08 NOTE — ED Notes (Signed)
Pt did not answer to be roomed and not seen in lobby.

## 2020-10-08 NOTE — ED Notes (Signed)
No answer when called multiple times 

## 2020-10-26 ENCOUNTER — Other Ambulatory Visit: Payer: Self-pay

## 2020-10-26 ENCOUNTER — Ambulatory Visit (INDEPENDENT_AMBULATORY_CARE_PROVIDER_SITE_OTHER): Payer: Self-pay | Admitting: Family Medicine

## 2020-10-26 ENCOUNTER — Encounter: Payer: Self-pay | Admitting: Family Medicine

## 2020-10-26 VITALS — BP 130/80 | HR 61 | Ht 71.0 in | Wt 240.0 lb

## 2020-10-26 DIAGNOSIS — Z23 Encounter for immunization: Secondary | ICD-10-CM

## 2020-10-26 DIAGNOSIS — M6282 Rhabdomyolysis: Secondary | ICD-10-CM

## 2020-10-26 NOTE — Assessment & Plan Note (Signed)
I am suspicious that his lab abnormalities, including his elevated AST and ALT and CK in addition to a mild ESR elevation are all stemming from a similar problem.  I am suspicious that there has been some muscle inflammation in the past months.  Based on our conversation today, it is possible this is all related to his preworkout supplement Nugenex Thermo.  We will follow up with repeat labs today including a CMP, CK and TSH.  If his labs are normal appearing today, we can conclude our work-up and stated these abnormalities are likely related to his preworkout supplement.  If his AST, ALT and CK remains elevated, I do have some suspicion for a myositis.  In this case, I will refer to rheumatology for further assessment and work-up.

## 2020-10-26 NOTE — Progress Notes (Signed)
    SUBJECTIVE:   CHIEF COMPLAINT / HPI:   Lab abnormalities Donald Mcintosh is returning to clinic today to follow-up regarding ongoing lab abnormalities.  He was initially seen several months ago when he was found to have mildly elevated liver enzymes.  Since this initial visit, he has had several visits to the emergency room and urgent care.  Most recently on 3/10, he was found to have an elevated CK up to 1500 which dropped to about 800 prior to his discharge from the emergency room.  When he was last seen in clinic on 3/7, he was told to stop all vitamin supplements and preworkout supplements.  He reports that he has not taken any of his new Genex Thermo preworkout supplement since his last visit.  He has not been taking any medication, over-the-counter or prescribed since that visit.  He is following up today in clinic to see if any further work-up needs to be done.  Today, he feels well and is not having any issues with fatigue, muscle weakness, rashes, joint aches or pains.  He denies vision trouble or respiratory issues.  PERTINENT  PMH / PSH: Noncontributory  OBJECTIVE:   BP 130/80   Pulse 61   Ht _0  (1.803 m)   Wt 240 lb (108.9 kg)   SpO2 97%   BMI 33.47 kg/m    General: Athletic, well-appearing 25 year old man.  Muscular.  No evidence of muscle atrophy.  He was able to comfortably stand up and walk around the room and perform 10 air squats without any notable muscle fatigue. Cardio: Normal S1 and S2, no S3 or S4. Rhythm is regular. No murmurs or rubs.   Pulm: Clear to auscultation bilaterally, no crackles, wheezing, or diminished breath sounds. Normal respiratory effort Abdomen: Bowel sounds normal. Abdomen soft and non-tender.  Extremities: No peripheral edema. Warm/ well perfused.  Strong radial pulses. Neuro: Cranial nerves grossly intact  ASSESSMENT/PLAN:   Rhabdomyolysis I am suspicious that his lab abnormalities, including his elevated AST and ALT and CK in addition  to a mild ESR elevation are all stemming from a similar problem.  I am suspicious that there has been some muscle inflammation in the past months.  Based on our conversation today, it is possible this is all related to his preworkout supplement Nugenex Thermo.  We will follow up with repeat labs today including a CMP, CK and TSH.  If his labs are normal appearing today, we can conclude our work-up and stated these abnormalities are likely related to his preworkout supplement.  If his AST, ALT and CK remains elevated, I do have some suspicion for a myositis.  In this case, I will refer to rheumatology for further assessment and work-up.   Return to work As he currently feels well with no evidence of fatigue or muscle weakness, he is safe and appropriate to return to work with full duties. -Work note provided via MyChart  Hepatitis B nonimmune Previous work-up demonstrated insufficient immunity to hepatitis B.  He was interested in an additional vaccine today. -Hepatitis B provided  Matilde Haymaker, MD [place]

## 2020-10-26 NOTE — Patient Instructions (Signed)
It was great to see you today.  Here is a quick review of the things we talked about:   Muscle inflammation: We will get some labs today to see if there is any evidence of continued inflammation of your muscles.  If everything is normal today, you are cleared for all activity.  If there continues to be abnormality, I will send you to a special doctor called a rheumatologist.   If all of your labs are normal, I will send you a message over my chart or send you a letter.  If there is anything to discuss, I will give you a phone call.

## 2020-10-27 ENCOUNTER — Other Ambulatory Visit: Payer: Self-pay | Admitting: Family Medicine

## 2020-10-27 DIAGNOSIS — M609 Myositis, unspecified: Secondary | ICD-10-CM | POA: Insufficient documentation

## 2020-10-27 LAB — COMPREHENSIVE METABOLIC PANEL
ALT: 64 IU/L — ABNORMAL HIGH (ref 0–44)
AST: 76 IU/L — ABNORMAL HIGH (ref 0–40)
Albumin/Globulin Ratio: 1.7 (ref 1.2–2.2)
Albumin: 4.4 g/dL (ref 4.1–5.2)
Alkaline Phosphatase: 87 IU/L (ref 44–121)
BUN/Creatinine Ratio: 14 (ref 9–20)
BUN: 15 mg/dL (ref 6–20)
Bilirubin Total: <0.2 mg/dL (ref 0.0–1.2)
CO2: 24 mmol/L (ref 20–29)
Calcium: 9 mg/dL (ref 8.7–10.2)
Chloride: 105 mmol/L (ref 96–106)
Creatinine, Ser: 1.05 mg/dL (ref 0.76–1.27)
Globulin, Total: 2.6 g/dL (ref 1.5–4.5)
Glucose: 97 mg/dL (ref 65–99)
Potassium: 4.6 mmol/L (ref 3.5–5.2)
Sodium: 141 mmol/L (ref 134–144)
Total Protein: 7 g/dL (ref 6.0–8.5)
eGFR: 100 mL/min/{1.73_m2} (ref >59)

## 2020-10-27 LAB — CK: Total CK: 2640 U/L (ref 49–439)

## 2020-10-27 LAB — TSH: TSH: 1.35 u[IU]/mL (ref 0.450–4.500)

## 2020-10-27 NOTE — Progress Notes (Unsigned)
Called and informed that his liver enzymes remain elevated.  I have placed a referral to rheumatology for further work-up.  I will also move forward with an ESR and CRP on his blood samples from yesterday.

## 2020-10-27 NOTE — Progress Notes (Signed)
CRP yes, ESR no. I will fax the add-on request this morning for the CRP

## 2020-10-29 LAB — SPECIMEN STATUS REPORT

## 2020-10-29 LAB — C-REACTIVE PROTEIN: CRP: 1 mg/L (ref 0–10)

## 2020-11-15 NOTE — Progress Notes (Deleted)
Office Visit Note  Patient: Donald Mcintosh             Date of Birth: 1996/03/02           MRN: [id number]             PCP: Mirian Mo, MD Referring: Doreene Eland, MD Visit Date: 11/16/2020 Occupation: @GUAROCC @  Subjective:  No chief complaint on file.   History of Present Illness: Donald Mcintosh is a 25 y.o. male here for evaluation of myositis with elevated CK and LFTs.***   Activities of Daily Living:  Patient reports morning stiffness for *** {minute/hour:19697}.   Patient {ACTIONS;DENIES/REPORTS:21021675::"Denies"} nocturnal pain.  Difficulty dressing/grooming: {ACTIONS;DENIES/REPORTS:21021675::"Denies"} Difficulty climbing stairs: {ACTIONS;DENIES/REPORTS:21021675::"Denies"} Difficulty getting out of chair: {ACTIONS;DENIES/REPORTS:21021675::"Denies"} Difficulty using hands for taps, buttons, cutlery, and/or writing: {ACTIONS;DENIES/REPORTS:21021675::"Denies"}  No Rheumatology ROS completed.   PMFS History:  Patient Active Problem List   Diagnosis Date Noted  . 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 Thoracic back pain 10/05/2020  . Toe pain 08/12/2020  . Elevated liver enzymes 08/12/2020  . Tinea pedis 09/18/2019  . Bipolar disorder (HCC) 09/18/2019  . History of ADHD 09/18/2019  . Unwanted fertility 09/18/2019  . Current smoker 09/18/2019    Past Medical History:  Diagnosis Date  . Asthma     Family History  Problem Relation Age of Onset  . Hypertension Father   . Cancer Maternal Great-grandmother   . Stroke Cousin   . Kidney disease Cousin    No past surgical history on file. Social History   Social History Narrative  . Not on file   Immunization History  Administered Date(s) Administered  . Hepatitis B, adult 10/26/2020     Objective: Vital Signs: There were no vitals taken for this visit.   Physical Exam   Musculoskeletal Exam: ***  CDAI Exam: CDAI Score: -- Patient Global: --; Provider Global: -- Swollen: --; Tender: -- Joint Exam  11/16/2020   No joint exam has been documented for this visit   There is currently no information documented on the homunculus. Go to the Rheumatology activity and complete the homunculus joint exam.  Investigation: No additional findings.  Imaging: No results found.  Recent Labs: Lab Results  Component Value Date   WBC 7.6 10/08/2020   HGB 15.9 10/08/2020   PLT 282 10/08/2020   NA 141 10/26/2020   K 4.6 10/26/2020   CL 105 10/26/2020   CO2 24 10/26/2020   GLUCOSE 97 10/26/2020   BUN 15 10/26/2020   CREATININE 1.05 10/26/2020   BILITOT <0.2 10/26/2020   ALKPHOS 87 10/26/2020   AST 76 (H) 10/26/2020   ALT 64 (H) 10/26/2020   PROT 7.0 10/26/2020   ALBUMIN 4.4 10/26/2020   CALCIUM 9.0 10/26/2020   GFRAA 133 08/12/2020    Speciality Comments: No specialty comments available.  Procedures:  No procedures performed Allergies: Patient has no known allergies.   Assessment / Plan:     Visit Diagnoses: No diagnosis found.  Orders: No orders of the defined types were placed in this encounter.  No orders of the defined types were placed in this encounter.   Face-to-face time spent with patient was *** minutes. Greater than 50% of time was spent in counseling and coordination of care.  Follow-Up Instructions: No follow-ups on file.   10/10/2020, MD  Note - This record has been created using [employer] Plan.  Chart creation errors have been sought, but may not always  have been located. Such creation errors do  not reflect on  the standard of medical care.

## 2020-11-16 ENCOUNTER — Ambulatory Visit: Payer: Medicaid Other | Admitting: Internal Medicine

## 2020-11-28 ENCOUNTER — Other Ambulatory Visit: Payer: Self-pay

## 2020-11-28 ENCOUNTER — Ambulatory Visit (HOSPITAL_COMMUNITY)
Admission: EM | Admit: 2020-11-28 | Discharge: 2020-11-28 | Disposition: A | Discharge status: Home / Self Care | Payer: Medicaid Other

## 2020-11-28 ENCOUNTER — Encounter (HOSPITAL_COMMUNITY): Payer: Self-pay

## 2020-11-28 DIAGNOSIS — S300XXA Contusion of lower back and pelvis, initial encounter: Secondary | ICD-10-CM

## 2020-11-28 DIAGNOSIS — M545 Low back pain, unspecified: Secondary | ICD-10-CM

## 2020-11-28 DIAGNOSIS — W19XXXA Unspecified fall, initial encounter: Secondary | ICD-10-CM

## 2020-11-28 NOTE — Discharge Instructions (Signed)
Take ibuprofen or naproxen for pain.  You can also take Tylenol as needed for pain.    Take your muscle relaxer as needed for muscle pain and spasms.    Rest and drink plenty of fluids.    Return or go to the Emergency Department if symptoms worsen or do not improve in the next few days.

## 2020-11-28 NOTE — ED Triage Notes (Signed)
Pt presents with lower back pain after falling on steps and landing on his back yesterday.

## 2020-11-28 NOTE — ED Provider Notes (Signed)
[facility]    CSN: [id number] Arrival date & time: 11/28/20  1737      History   Chief Complaint Chief Complaint  Patient presents with  . Fall  . Back Pain    HPI Donald Mcintosh is a 25 y.o. male.   Patient here for evaluation of lower back pain following fall yesterday.  Reports falling down stairs and landed on back.  Denies any numbness or tingling in legs or arms.  Denies any loss of bowel or bladder control.  Patient with full range of motion in all extremities.  Has not taken any OTC medications or treatments.  Reports pain worse with movement.  Denies any fevers, chest pain, shortness of breath, N/V/D, numbness, tingling, weakness, abdominal pain, or headaches.   ROS: As per HPI, all other pertinent ROS negative   The history is provided by the patient.  Fall  Back Pain   Past Medical History:  Diagnosis Date  . Asthma     Patient Active Problem List   Diagnosis Date Noted  . 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 Thoracic back pain 10/05/2020  . Toe pain 08/12/2020  . Elevated liver enzymes 08/12/2020  . Tinea pedis 09/18/2019  . Bipolar disorder (HCC) 09/18/2019  . History of ADHD 09/18/2019  . Unwanted fertility 09/18/2019  . Current smoker 09/18/2019    History reviewed. No pertinent surgical history.     Home Medications    Prior to Admission medications   Medication Sig Start Date End Date Taking? Authorizing Provider  tizanidine (ZANAFLEX) 2 MG capsule Take 1 capsule (2 mg total) by mouth 3 (three) times daily. 10/05/20   Moses Manners, MD    Family History Family History  Problem Relation Age of Onset  . Hypertension Father   . Cancer Maternal Great-grandmother   . Stroke Cousin   . Kidney disease Cousin     Social History Social History   Tobacco Use  . Smoking status: Current Every Day Smoker    Types: Cigarettes  . Smokeless tobacco: Never Used  . Tobacco comment: pt stated that he quit yesterday  Substance Use Topics  .  Alcohol use: No  . Drug use: No     Allergies   Patient has no known allergies.   Review of Systems Review of Systems  Musculoskeletal: Positive for back pain.  All other systems reviewed and are negative.    Physical Exam Triage Vital Signs ED Triage Vitals  Enc Vitals Group     BP 11/28/20 1808 125/86     Pulse Rate 11/28/20 1808 66     Resp 11/28/20 1808 18     Temp 11/28/20 1808 99.1 F (37.3 C)     Temp Source 11/28/20 1808 Oral     SpO2 11/28/20 1808 96 %     Weight --      Height --      Head Circumference --      Peak Flow --      Pain Score 11/28/20 1807 7     Pain Loc --      Pain Edu? --      Excl. in GC? --    No data found.  Updated Vital Signs BP 125/86 (BP Location: Left Arm)   Pulse 66   Temp 99.1 F (37.3 C) (Oral)   Resp 18   SpO2 96%   Visual Acuity Right Eye Distance:   Left Eye Distance:   Bilateral Distance:    Right  Eye Near:   Left Eye Near:    Bilateral Near:     Physical Exam Vitals and nursing note reviewed.  Constitutional:      General: He is not in acute distress.    Appearance: Normal appearance. He is not ill-appearing, toxic-appearing or diaphoretic.  HENT:     Head: Normocephalic and atraumatic.  Eyes:     Conjunctiva/sclera: Conjunctivae normal.  Cardiovascular:     Rate and Rhythm: Normal rate.     Pulses: Normal pulses.  Pulmonary:     Effort: Pulmonary effort is normal.  Abdominal:     General: Abdomen is flat.  Musculoskeletal:        General: Normal range of motion.     Cervical back: Normal, normal range of motion and neck supple.     Thoracic back: Normal.     Lumbar back: Tenderness present. No swelling, edema, deformity or bony tenderness. Normal range of motion. Negative right straight leg raise test and negative left straight leg raise test.  Skin:    General: Skin is warm and dry.  Neurological:     General: No focal deficit present.     Mental Status: He is alert and oriented to person,  place, and time.  Psychiatric:        Mood and Affect: Mood normal.      UC Treatments / Results  Labs (all labs ordered are listed, but only abnormal results are displayed) Labs Reviewed - No data to display  EKG   Radiology No results found.  Procedures Procedures (including critical care time)  Medications Ordered in UC Medications - No data to display  Initial Impression / Assessment and Plan / UC Course  I have reviewed the triage vital signs and the nursing notes.  Pertinent labs & imaging results that were available during my care of the patient were reviewed by me and considered in my medical decision making (see chart for details).     Assessment negative for red flags or concerns.  Recommend taking ibuprofen or naproxen as needed.  May also take Tylenol as needed for pain.  Patient has previously been prescribed a muscle relaxer and encouraged to take that as needed for muscle pain and spasms.  Encourage rest and fluids.  Work note given to patient.  Follow-up as needed.   Final Clinical Impressions(s) / UC Diagnoses   Final diagnoses:  Acute bilateral low back pain without sciatica  Fall, initial encounter  Contusion of lower back, initial encounter     Discharge Instructions     Take ibuprofen or naproxen for pain.  You can also take Tylenol as needed for pain.    Take your muscle relaxer as needed for muscle pain and spasms.    Rest and drink plenty of fluids.    Return or go to the Emergency Department if symptoms worsen or do not improve in the next few days.      ED Prescriptions    None     PDMP not reviewed this encounter.   Ivette Loyal, NP 11/28/20 [phone]

## 2021-01-21 ENCOUNTER — Other Ambulatory Visit: Payer: Self-pay

## 2021-01-21 ENCOUNTER — Ambulatory Visit (HOSPITAL_COMMUNITY)
Admission: EM | Admit: 2021-01-21 | Discharge: 2021-01-21 | Disposition: A | Discharge status: Home / Self Care | Payer: Self-pay | Attending: Physician Assistant | Admitting: Physician Assistant

## 2021-01-21 ENCOUNTER — Encounter (HOSPITAL_COMMUNITY): Payer: Self-pay

## 2021-01-21 DIAGNOSIS — M546 Pain in thoracic spine: Secondary | ICD-10-CM

## 2021-01-21 MED ORDER — KETOROLAC TROMETHAMINE 30 MG/ML IJ SOLN
30.0000 mg | Freq: Once | INTRAMUSCULAR | Status: AC
  Administered 2021-01-21: 30 mg via INTRAMUSCULAR

## 2021-01-21 MED ORDER — KETOROLAC TROMETHAMINE 30 MG/ML IJ SOLN
INTRAMUSCULAR | Status: AC
  Filled 2021-01-21: qty 1

## 2021-01-21 MED ORDER — METHOCARBAMOL 500 MG PO TABS: 500.0000 mg | ORAL_TABLET | Freq: Three times a day (TID) | ORAL | 0 refills | Status: DC | PRN

## 2021-01-21 NOTE — Discharge Instructions (Addendum)
We gave you Toradol so please try to avoid NSAIDs (aspirin, ibuprofen/Advil, naproxen/Aleve) for the rest of today.  You can use Tylenol for breakthrough pain.  Use Robaxin up to 3 times a day as needed.  This make you sleepy so do not drive or drink alcohol with this medication.  Use heat, rest, stretch for symptom relief.  Try to avoid heavy lifting or strenuous activity.  If anything worsens please return for reevaluation as we discussed.

## 2021-01-21 NOTE — ED Triage Notes (Signed)
Pt reports right scapular pain  'feels like muscle spasm" since 11:16 am today. Reports he was at work when pain started. He works receiving and delivering heavy packages. Pain is worse with movements.  Denies chest pain, shortness of breath, dizziness, headache.   Pt reports he took a "green pill"muscle relaxer 1 hr ago.

## 2021-01-21 NOTE — ED Provider Notes (Signed)
[facility]    CSN: [id number] Arrival date & time: 01/21/21  1439      History   Chief Complaint Chief Complaint  Patient presents with   Back Pain    HPI Donald Mcintosh is a 25 y.o. male.   Patient presents today with a several hour history of right thoracic back pain.  Reports that he was at work when symptoms gradually began.  He denies any specific injury or change in activity prior to symptom onset.  He does have a physically demanding job which he believes exacerbated symptoms.  He did try Zanaflex without improvement of symptoms.  He reports pain is rated 8 on a 0-10 pain scale, localized to right thoracic back, described as aching with periodic sharp pains, worse with certain movements, no alleviating factors identified.  He denies any urinary symptoms.  Denies any history of kidney stones.  Denies any weakness, numbness, paresthesias.  He has not tried any over-the-counter medications for symptom management.  Denies previous spinal surgery or injury.  He is requesting work excuse note if appropriate today.   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 No pertinent surgical history.     Home Medications    Prior to Admission medications   Medication Sig Start Date End Date Taking? Authorizing Provider  methocarbamol (ROBAXIN) 500 MG tablet Take 1 tablet (500 mg total) by mouth every 8 (eight) hours as needed for muscle spasms. 01/21/21  Yes Kassim Guertin, Noberto Retort, PA-C    Family History Family History  Problem Relation Age of Onset   Hypertension Father    Cancer Maternal Great-grandmother    Stroke Cousin     Kidney disease Cousin     Social History Social History   Tobacco Use   Smoking status: Every Day    Pack years: 0.00    Types: Cigarettes   Smokeless tobacco: Never   Tobacco comments:    pt stated that he quit yesterday  Substance Use Topics   Alcohol use: No   Drug use: No     Allergies   Patient has no known allergies.   Review of Systems Review of Systems  Constitutional:  Positive for activity change. Negative for appetite change, fatigue and fever.  Respiratory:  Negative for cough and shortness of breath.   Cardiovascular:  Negative for chest pain.  Gastrointestinal:  Negative for abdominal pain, diarrhea, nausea and vomiting.  Musculoskeletal:  Positive for back pain. Negative for arthralgias and myalgias.  Neurological:  Negative for dizziness, weakness, light-headedness, numbness and headaches.    Physical Exam Triage Vital Signs ED Triage Vitals  Enc Vitals Group     BP 01/21/21 1521 103/74     Pulse Rate 01/21/21 1521 86     Resp 01/21/21 1521 18     Temp 01/21/21 1521 99.2 F (37.3 C)     Temp Source 01/21/21 1521 Oral     SpO2 01/21/21 1521 97 %     Weight --      Height --      Head Circumference --  Peak Flow --      Pain Score 01/21/21 1524 8     Pain Loc --      Pain Edu? --      Excl. in GC? --    No data found.  Updated Vital Signs BP 103/74 (BP Location: Right Arm)   Pulse 86   Temp 99.2 F (37.3 C) (Oral)   Resp 18   SpO2 97%   Visual Acuity Right Eye Distance:   Left Eye Distance:   Bilateral Distance:    Right Eye Near:   Left Eye Near:    Bilateral Near:     Physical Exam Vitals reviewed.  Constitutional:      General: He is awake.     Appearance: Normal appearance. He is normal weight. He is not ill-appearing.     Comments: Very pleasant male appears stated age in no acute distress  HENT:     Head: Normocephalic and atraumatic.  Cardiovascular:     Rate and Rhythm: Normal rate and regular rhythm.      Heart sounds: Normal heart sounds, S1 normal and S2 normal. No murmur heard. Pulmonary:     Effort: Pulmonary effort is normal.     Breath sounds: Normal breath sounds. No stridor. No wheezing, rhonchi or rales.     Comments: Clear to auscultation bilaterally Abdominal:     General: Bowel sounds are normal.     Palpations: Abdomen is soft.     Tenderness: There is no abdominal tenderness. There is no right CVA tenderness, left CVA tenderness, guarding or rebound.  Musculoskeletal:     Cervical back: No tenderness or bony tenderness.     Thoracic back: Tenderness present. No bony tenderness.     Lumbar back: No tenderness or bony tenderness.     Comments: Back: Decreased range of motion with rotation and forward flexion.  No pain with percussion of vertebrae.  Tenderness to palpation of right thoracic lumbar paraspinal muscles.  No deformity or step-off noted.  Neurological:     Mental Status: He is alert.  Psychiatric:        Behavior: Behavior is cooperative.     UC Treatments / Results  Labs (all labs ordered are listed, but only abnormal results are displayed) Labs Reviewed - No data to display  EKG   Radiology No results found.  Procedures Procedures (including critical care time)  Medications Ordered in UC Medications  ketorolac (TORADOL) 30 MG/ML injection 30 mg (has no administration in time range)    Initial Impression / Assessment and Plan / UC Course  I have reviewed the triage vital signs and the nursing notes.  Pertinent labs & imaging results that were available during my care of the patient were reviewed by me and considered in my medical decision making (see chart for details).      No indication for x-ray given lack of bony tenderness and a traumatic injury.  Patient was given Toradol in clinic with improvement of symptoms.  He was instructed to avoid NSAIDs for 24 hours but then alternate Tylenol and ibuprofen for pain relief.  He was given Robaxin  with instruction not to drive or drink alcohol with this medication as drowsiness is a common side effect.  Encouraged him to use heat, rest, stretch for additional symptom relief.  He was provided work excuse note instructed to avoid strenuous activity for several days to see if symptoms improve.  Discussed alarm symptoms that warrant emergent evaluation.  Strict return  precautions given to which patient expressed understanding.  Final Clinical Impressions(s) / UC Diagnoses   Final diagnoses:  Acute right-sided thoracic back pain     Discharge Instructions      We gave you Toradol so please try to avoid NSAIDs (aspirin, ibuprofen/Advil, naproxen/Aleve) for the rest of today.  You can use Tylenol for breakthrough pain.  Use Robaxin up to 3 times a day as needed.  This make you sleepy so do not drive or drink alcohol with this medication.  Use heat, rest, stretch for symptom relief.  Try to avoid heavy lifting or strenuous activity.  If anything worsens please return for reevaluation as we discussed.     ED Prescriptions     Medication Sig Dispense Auth. Provider   methocarbamol (ROBAXIN) 500 MG tablet Take 1 tablet (500 mg total) by mouth every 8 (eight) hours as needed for muscle spasms. 21 tablet Keanan Melander, Noberto Retort, PA-C      PDMP not reviewed this encounter.   Jeani Hawking, PA-C 01/21/21 1551

## 2021-06-17 ENCOUNTER — Ambulatory Visit (HOSPITAL_COMMUNITY)
Admission: EM | Admit: 2021-06-17 | Discharge: 2021-06-17 | Disposition: A | Discharge status: Home / Self Care | Payer: Self-pay | Attending: Urgent Care | Admitting: Urgent Care

## 2021-06-17 ENCOUNTER — Other Ambulatory Visit: Payer: Self-pay

## 2021-06-17 ENCOUNTER — Ambulatory Visit (INDEPENDENT_AMBULATORY_CARE_PROVIDER_SITE_OTHER): Payer: Self-pay

## 2021-06-17 ENCOUNTER — Encounter (HOSPITAL_COMMUNITY): Payer: Self-pay | Admitting: Emergency Medicine

## 2021-06-17 DIAGNOSIS — M79642 Pain in left hand: Secondary | ICD-10-CM

## 2021-06-17 DIAGNOSIS — W231XXA Caught, crushed, jammed, or pinched between stationary objects, initial encounter: Secondary | ICD-10-CM

## 2021-06-17 DIAGNOSIS — M79645 Pain in left finger(s): Secondary | ICD-10-CM

## 2021-06-17 DIAGNOSIS — S60222A Contusion of left hand, initial encounter: Secondary | ICD-10-CM

## 2021-06-17 MED ORDER — NAPROXEN 500 MG PO TABS: 500.0000 mg | ORAL_TABLET | Freq: Two times a day (BID) | ORAL | 0 refills | Status: DC

## 2021-06-17 NOTE — ED Provider Notes (Signed)
[facility]   MRN: [id number] DOB: 1995/11/11  Subjective:   Donald Mcintosh is a 25 y.o. male presenting for 2-day history of acute onset persistent left hand pain, swelling, decreased range of motion to the left middle finger.  Has used Tylenol with some very temporary relief.  Has been applying splints to the left middle finger, left index finger.  Symptoms started from a crush injury at work, a pallet landed on his left hand.  No current facility-administered medications for this encounter.  Current Outpatient Medications:    methocarbamol (ROBAXIN) 500 MG tablet, Take 1 tablet (500 mg total) by mouth every 8 (eight) hours as needed for muscle spasms., Disp: 21 tablet, Rfl: 0   No Known Allergies  Past Medical History:  Diagnosis Date   Asthma      History reviewed. No pertinent surgical history.  Family History  Problem Relation Age of Onset   Hypertension Father    Cancer Maternal Great-grandmother    Stroke Cousin    Kidney disease Cousin     Social History   Tobacco Use   Smoking status: Every Day    Types: Cigarettes   Smokeless tobacco: Never   Tobacco comments:    pt stated that he quit yesterday  Substance Use Topics   Alcohol use: No   Drug use: No    ROS   Objective:   Vitals: BP (!) 140/91   Pulse (!) 52   Temp 98 F (36.7 C)   Resp 18   SpO2 96%   Physical Exam Constitutional:      General: He is not in acute distress.    Appearance: Normal appearance. He is well-developed and normal weight. He is not ill-appearing, toxic-appearing or diaphoretic.  HENT:     Head: Normocephalic and atraumatic.     Right Ear: External ear normal.     Left Ear: External ear normal.     Nose: Nose normal.     Mouth/Throat:     Pharynx: Oropharynx is clear.  Eyes:     General: No scleral icterus.       Right eye: No discharge.        Left eye: No discharge.     Extraocular Movements: Extraocular movements intact.     Pupils:  Pupils are equal, round, and reactive to light.  Cardiovascular:     Rate and Rhythm: Normal rate.  Pulmonary:     Effort: Pulmonary effort is normal.  Musculoskeletal:       Hands:     Cervical back: Normal range of motion.  Neurological:     Mental Status: He is alert and oriented to person, place, and time.  Psychiatric:        Mood and Affect: Mood normal.        Behavior: Behavior normal.        Thought Content: Thought content normal.        Judgment: Judgment normal.    DG Hand Complete Left  Result Date: 06/17/2021 CLINICAL DATA:  Left hand and finger pain. Hand was caught in the pallet today. Swelling. EXAM: LEFT HAND - COMPLETE 3+ VIEW COMPARISON:  None. FINDINGS: There is no evidence of fracture or dislocation. Normal joint spaces and alignment. There is no evidence of arthropathy or other focal bone abnormality. Mild soft tissue edema. No soft tissue air. IMPRESSION: No osseous abnormalities.  Soft tissue edema. Electronically Signed   By: Narda Rutherford M.D.   On:  06/17/2021 19:32     Assessment and Plan :   PDMP not reviewed this encounter.  1. Contusion of left hand, initial encounter   2. Pain of left middle finger   3. Left hand pain    No fracture noted on x-ray.  Recommended conservative management for left hand contusion.  Naproxen for pain and inflammation.  Follow-up with [facility]. Counseled patient on potential for adverse effects with medications prescribed/recommended today, ER and return-to-clinic precautions discussed, patient verbalized understanding.    Wallis Bamberg, PA-C 06/17/21 1949

## 2021-06-17 NOTE — ED Triage Notes (Signed)
Pt is present today with an injury to his left middle finger. Pt states that his hand was caught in a pallet today and now he is experiencing swelling. Pt states that he cannot feel any pain in his finger they are numb

## 2021-09-09 NOTE — Progress Notes (Signed)
SUBJECTIVE:   CHIEF COMPLAINT / HPI:   Medical history includes a diagnosis of bipolar disorder.  Previously elevated AST/ALT/CK.   This was thought to be in the setting of a dietary supplement but after this dietary supplement was discontinued these levels remained elevated.  The last providers note recommend referral to rheumatology.  Patient also had a CRP and TSH checked which were within normal limits.  This referral appears to be closed due to no-show. He states he occasionally has back and chest pains similar to then. He states he had this off and on for "most of his life".   Chest pains: No shortness of breath. Massage improves it but breathing cold air makes it worse. Rest does seem to make it better. No cough. No chest pain with activity when it isn't cold out. He states he used to have asthma as a child and had similar symptoms with chest pain back then. Sometimes gets wheezy with activity. Does not wake up coughing.   History of bipolar: No SI or HI but he states he feels a bit run down in general. States his biggest issue when he feels more depressed is lack of sleep. He doesn't feel depressed currently.  States he feels like he may be having some worsening of ADHD which she was diagnosed with previously but no longer takes medications for.  Tobacco use: Smoked since 26yo about 1/2ppd for about 15 years. Quit last year. Alcohol use: None Drug use: Marijuana on occasion.  Exercise: Lifts 3x per week and does cardio  PERTINENT  PMH / PSH: History of bipolar disorder  OBJECTIVE:   BP 131/86    Pulse 80    Ht 5\' 11"  (1.803 m)    Wt 234 lb 4 oz (106.3 kg)    SpO2 99%    BMI 32.67 kg/m    General: NAD, pleasant, able to participate in exam HEENT: No pharyngeal erythema, no cervical lymphadenopathy Cardiac: RRR, no murmurs. Respiratory: CTAB, normal effort, No wheezes, rales or rhonchi Abdomen: Bowel sounds present, nontender, nondistended, no hepatosplenomegaly. MSK: Mild  discomfort when palpating the right rhomboid region.  No midline discomfort to palpation of his upper back.  He has no pain with palpation of the anterior chest wall. Skin: warm and dry, no rashes noted Neuro: alert, no obvious focal deficits Psych: Normal affect and mood  ASSESSMENT/PLAN:   No problem-specific Assessment & Plan notes found for this encounter.   History of elevation of AST/ALT/CK: Initially thought to be due to to a exercise supplement but when this was discontinued these levels remained elevated.  He was referred to rheumatology but looks like he was never able to go to an appointment for them.  He continues to experience some occasional upper back pain in his rhomboid region as well as generalized aches and pains which she states he has had "throughout his life". Will check CMP and CK level.   History of asthma: Takes no medications at this time.  Does not wake up coughing or wheezing at night but does sometimes get wheezy during activities.  Has a previous smoking history but no longer smokes.  He is a very active individual but sometimes gets some chest tightness during cold weather suggestive of asthma as a possible cause. Will order PFTs   Chest pain: He is not experiencing them at this time.  He does get it sometimes during activity but only when it is cold out and has a history of asthma.  He states it feels similar to when he had asthma attacks when he was younger.  He does not get it when he is active and it is not cold outside.  He is an active individual who lifts weights 3 times per week and does cardio on other days.  This does not appear anginal and it is not associated with shortness of breath.  Follow-up as needed after we complete PFT testing.  History of bipolar disorder: No SI or HI today.  Says he had concomitant ADHD when he was younger and thinks he continues to have some issues with ADHD.  I recommended psychiatry follow-up to discuss ideal medications in  his situation and he is on board with this.  I placed this referral.  Given him resources as well.  Lurline Del, [place]

## 2021-09-10 ENCOUNTER — Encounter: Payer: Self-pay | Admitting: Family Medicine

## 2021-09-10 ENCOUNTER — Other Ambulatory Visit: Payer: Self-pay

## 2021-09-10 ENCOUNTER — Ambulatory Visit (INDEPENDENT_AMBULATORY_CARE_PROVIDER_SITE_OTHER): Payer: Managed Care, Other (non HMO) | Admitting: Family Medicine

## 2021-09-10 VITALS — BP 131/86 | HR 80 | Ht 71.0 in | Wt 234.2 lb

## 2021-09-10 DIAGNOSIS — M6282 Rhabdomyolysis: Secondary | ICD-10-CM

## 2021-09-10 DIAGNOSIS — R748 Abnormal levels of other serum enzymes: Secondary | ICD-10-CM | POA: Diagnosis not present

## 2021-09-10 DIAGNOSIS — F3170 Bipolar disorder, currently in remission, most recent episode unspecified: Secondary | ICD-10-CM

## 2021-09-10 DIAGNOSIS — Z8709 Personal history of other diseases of the respiratory system: Secondary | ICD-10-CM | POA: Diagnosis not present

## 2021-09-10 NOTE — Patient Instructions (Signed)
Today we placed a referral for psychiatry for history of bipolar disorder.  I am providing some resources below for you to reach out to schedule an appointment.  You can tell them that you do have a referral.  We are checking some lab work today including your liver levels because you have had an elevation of these in the past.  If these remain elevated we will discuss next steps when I reach out to you.  I am placing an order for lung testing to see if you still have any history of asthma because we can treat this if you do.  When you go upfront please schedule follow-up with Dr. Raymondo Band for lung function testing.  I will discuss the results of this when it is finished.  I think your chest pain is most likely due to musculoskeletal or asthma based sources.  I do not have any significant concern for heart as the cause of your chest pain but if you develop any chest pain that does not improve or if the chest pain starts become more frequent you should see seen back.  Psychiatry Resource List (Adults and Children) Most of these providers will take Medicaid. please consult your insurance for a complete and updated list of available providers. When calling to make an appointment have your insurance information available to confirm you are covered.   [facility]:Psychiatry and Counseling [address] [place], [place] [address] [phone]  [facility]  [address] [place], [place] [phone] Crisis [phone]   [facility]:   [facility]: [address]     [phone]   [facility]: [address] [place]. [place],        [phone] [place]: [address] [phone],    [phone] [place]: [phone] [address],                   [phone] Children: [facility] [address]         [phone]  [facility] (virtual only) [phone]   [facility]  (Psychiatry only; Adults /children 12 and over, will take Medicaid)  [address] Laurell Josephs [address], [place], [place] [address]       [phone]   [facility] (Psychiatry & counseling ; adults & children ; will take Medicaid [address]  [place] [address]  Go on-line to complete referral ( [email or web address]://[email or web address] [phone]    (Spanish speaking therapists)  [facility]  Psychiatry & counseling; Adults and children;  Call Registration prior to scheduling an appointment [phone] [address]    [place], [place] [address]    [phone]  [facility] (Psychiatry & counseling; adults & children; Medicare no Medicaid)  [address]   [place], [place]  [address]      [phone]    [facility] (up to age 42)  Psychiatry & counseling ,will take Medicaid, must do counseling to receive psychiatry services  [address] [place] [address]        [phone]  [facility] (Psychiatry & counseling; adults & children; will take Medicaid) Will need a referral from provider [address],  [place], [place]  [phone]   [facility] --- Walk-In Mon-Friday 8am-3pm ( will take Medicaid, Psychiatry, Adults & children,  [address], [place], [place]   (  [phone]) [phone]   [facility], Walk-in M-F 8am-12pm and 1pm -3pm   (Counseling, Psychiatry, will take Medicaid, adults & children)  [address], [place], [place]  [phone]

## 2021-09-11 LAB — COMPREHENSIVE METABOLIC PANEL
ALT: 77 IU/L — ABNORMAL HIGH (ref 0–44)
AST: 76 IU/L — ABNORMAL HIGH (ref 0–40)
Albumin/Globulin Ratio: 2 (ref 1.2–2.2)
Albumin: 4.7 g/dL (ref 4.1–5.2)
Alkaline Phosphatase: 110 IU/L (ref 44–121)
BUN/Creatinine Ratio: 20 (ref 9–20)
BUN: 20 mg/dL (ref 6–20)
Bilirubin Total: 0.2 mg/dL (ref 0.0–1.2)
CO2: 21 mmol/L (ref 20–29)
Calcium: 10 mg/dL (ref 8.7–10.2)
Chloride: 102 mmol/L (ref 96–106)
Creatinine, Ser: 1.01 mg/dL (ref 0.76–1.27)
Globulin, Total: 2.4 g/dL (ref 1.5–4.5)
Glucose: 111 mg/dL — ABNORMAL HIGH (ref 70–99)
Potassium: 4.8 mmol/L (ref 3.5–5.2)
Sodium: 136 mmol/L (ref 134–144)
Total Protein: 7.1 g/dL (ref 6.0–8.5)
eGFR: 105 mL/min/{1.73_m2} (ref >59)

## 2021-09-11 LAB — CK: Total CK: 2659 U/L (ref 49–439)

## 2021-09-12 ENCOUNTER — Other Ambulatory Visit: Payer: Self-pay | Admitting: Family Medicine

## 2021-09-12 DIAGNOSIS — R748 Abnormal levels of other serum enzymes: Secondary | ICD-10-CM

## 2021-09-12 DIAGNOSIS — M6282 Rhabdomyolysis: Secondary | ICD-10-CM

## 2021-09-13 ENCOUNTER — Other Ambulatory Visit: Payer: Self-pay | Admitting: Family Medicine

## 2021-09-16 ENCOUNTER — Other Ambulatory Visit: Payer: Self-pay

## 2021-09-16 ENCOUNTER — Ambulatory Visit (INDEPENDENT_AMBULATORY_CARE_PROVIDER_SITE_OTHER): Payer: Managed Care, Other (non HMO) | Admitting: Pharmacist

## 2021-09-16 ENCOUNTER — Encounter: Payer: Self-pay | Admitting: Pharmacist

## 2021-09-16 DIAGNOSIS — F17200 Nicotine dependence, unspecified, uncomplicated: Secondary | ICD-10-CM

## 2021-09-16 NOTE — Assessment & Plan Note (Signed)
Patient has been experiencing chest tightness with colder temperatures. Hx childhood asthma.  Spirometry evaluation (pre-bronchodilator) reveals normal lung function.   -Reviewed results of pulmonary function tests.  -Could consider addition of PRN albuterol inhaler to use when symptoms of chest tightness comes on in cooler temperatures. Peak flow meter monitoring could also help. Will defer to PCP.

## 2021-09-16 NOTE — Progress Notes (Signed)
° °  S:    Patient arrives in good spirits accompanied by his daughter. Presents for lung function evaluation. Patient was referred and last seen by Dr. Atha Starks on 09/10/2021. Patient has not yet been seen by his Primary Care Provider, Dr. Laroy Apple.  Patient reports breathing has been 7 out of 10. Patient denies atopic sx consistent with allergies. He reports asthma like symptoms when he was a child. He remembers using an nebulizer starting around age 38 years old for asthma. Between [age] years old he reports using a nebulizer but then symptoms resolved and he no longer required treatment. Currently no inhaler use. 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 Less symptoms in warmer temperatures. Denies getting SOB or chest tightness with exercise.   Patient reports smoking every now and then. In the past month, he reports smoking three total cigarettes. He does not buy cigarettes but will use one of his coworkers' while at work. He expresses he wants to completely quit.   O: Physical Exam Pulmonary:     Effort: Pulmonary effort is normal.  Neurological:     Mental Status: He is alert.  Psychiatric:        Mood and Affect: Mood normal.        Behavior: Behavior normal.        Thought Content: Thought content normal.    Review of Systems  All other systems reviewed and are negative.  See "scanned report" or Documentation Flowsheet (discrete results - PFTs) for  Spirometry results. Patient provided good effort while attempting spirometry.  Lung Age = 55  A/P: Patient has been experiencing chest tightness with colder temperatures. Hx childhood asthma.  Spirometry evaluation (pre-bronchodilator) reveals normal lung function.   -Reviewed results of pulmonary function tests.  -Could consider addition of PRN albuterol inhaler to use when symptoms of chest tightness comes on in cooler temperatures. Peak flow meter monitoring could also help. Will  defer to PCP.   Tobacco abuse: -Encouraged complete smoking cessation.   Patient verbalized understanding of results and education. Written pt instructions provided.    F/U with PCP as needed. Total time in face to face counseling 25 minutes. Patient seen with Penny Pia, PharmD Candidate, and Pervis Hocking, PharmD - PGY2 Pharmacy Resident.  Marland Kitchen

## 2021-09-16 NOTE — Progress Notes (Signed)
Reviewed: I agree with Dr. Koval's documentation and management. 

## 2021-09-16 NOTE — Patient Instructions (Signed)
Nice meeting you today!  Your lung function tests indicated normal lung function. Follow up with your PCP.   We encourage you to continue working on quitting smoking completely! Let us know if you need any help with this.

## 2021-11-09 NOTE — Progress Notes (Signed)
? ?Office Visit Note ? ?Patient: Donald Mcintosh             ?Date of Birth: [birth year]           ?MRN: [id number]             ?PCP: Levin Erp, MD ?Referring: Doreene Eland, MD ?Visit Date: 11/10/2021 ?Occupation: [employer] ? ?Subjective:  ?New Patient (Initial Visit) (Abnormal labs) ? ? ?History of Present Illness: Donald Mcintosh is a 26 y.o. male here for evaluation of persistent elevated CK level. This was evaluated last year after an episode of severe muscle spasm with CK increased at 1569. He has had back pain and spasm intermittently over the past year. He takes methocarbamol occasionally for this he estimates about twice per month. Symptoms are usually on the right side in thoracic spine area. He modified exercise and nutritional supplement without resolution of findings. 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 He exercises about 1 hour daily with various resistance training. CK levels have varied from 878 to most recently high at 2659 with no normalization. Moderate AST and ALT elevated with unremarkable ultrasound checked for this. No renal function changes noted throughout this time. ?He does not drink alcohol, he smokes cigarettes, and has very infrequent use of marijuana about once in past year, no other recreational drug use. ?He had remote fracture of his left toe and sprained MCL of the left knee. He suffered a pinched nerve injury in his low back about 9 years ago this did not require specific intervention and only bothers him occasionally now. ? ? ?Activities of Daily Living:  ?Patient reports morning stiffness for 10 minutes.   ?Patient Reports nocturnal pain.  ?Difficulty dressing/grooming: Reports ?Difficulty climbing stairs: Denies ?Difficulty getting out of chair: Reports ?Difficulty using hands for taps, buttons, cutlery, and/or writing: Reports ? ?Review of Systems  ?Constitutional:  Positive for fatigue.   ?HENT:  Positive for mouth dryness.   ?Eyes:  Negative for dryness.  ?Respiratory:  Positive for shortness of breath.   ?Cardiovascular:  Negative for swelling in legs/feet.  ?Gastrointestinal:  Negative for constipation.  ?Endocrine: Positive for heat intolerance and excessive thirst.  ?Genitourinary:  Negative for difficulty urinating.  ?Musculoskeletal:  Positive for muscle weakness and morning stiffness.  ?Skin:  Negative for rash.  ?Allergic/Immunologic: Negative for susceptible to infections.  ?Neurological:  Negative for numbness.  ?Hematological:  Negative for bruising/bleeding tendency.  ?Psychiatric/Behavioral:  Negative for sleep disturbance.   ? ?PMFS History:  ?Patient Active Problem List  ? Diagnosis Date Noted  ? Elevated CK 11/10/2021  ? 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 Thoracic back pain 10/05/2020  ? Toe pain 08/12/2020  ? Elevated liver enzymes 08/12/2020  ? Tinea pedis 09/18/2019  ? Bipolar disorder (HCC) 09/18/2019  ? History of ADHD 09/18/2019  ? Unwanted fertility 09/18/2019  ? Current smoker 09/18/2019  ?  ?Past Medical History:  ?Diagnosis Date  ? Asthma   ?  ?Family History  ?Problem Relation Age of Onset  ? Hypertension Father   ? Stroke Cousin   ? Kidney disease Cousin   ? Cancer Maternal Great-grandmother   ? ?History reviewed. No pertinent surgical history. ?Social History  ? ?Social History Narrative  ? Not on file  ? ?Immunization History  ?Administered Date(s) Administered  ? Hepatitis B, adult 10/26/2020  ?  ? ?Objective: ?Vital Signs: BP (!) 158/91 (BP Location: Right  Arm, Patient Position: Sitting, Cuff Size: Normal)   Pulse 64   Resp 14   Ht 5\' 11"  (1.803 m)   Wt 241 lb (109.3 kg)   BMI 33.61 kg/m?   ? ?Physical Exam ?HENT:  ?   Right Ear: External ear normal.  ?   Left Ear: External ear normal.  ?   Mouth/Throat:  ?   Mouth: Mucous membranes are moist.  ?   Pharynx: Oropharynx is clear.  ?Eyes:  ?   Conjunctiva/sclera: Conjunctivae normal.  ?Neck:  ?   Vascular: No carotid bruit.   ?Cardiovascular:  ?   Rate and Rhythm: Normal rate and regular rhythm.  ?Pulmonary:  ?   Effort: Pulmonary effort is normal.  ?   Breath sounds: Normal breath sounds.  ?Musculoskeletal:  ?   Right lower leg: No edema.  ?   Left lower leg: No edema.  ?Lymphadenopathy:  ?   Cervical: No cervical adenopathy.  ?Skin: ?   General: Skin is warm and dry.  ?   Findings: No rash.  ?Neurological:  ?   General: No focal deficit present.  ?   Mental Status: He is alert.  ?   Deep Tendon Reflexes: Reflexes normal.  ?Psychiatric:     ?   Mood and Affect: Mood normal.  ?  ? ?Musculoskeletal Exam:  ?Neck full ROM no tenderness ?Shoulders full ROM no tenderness or swelling ?Elbows full ROM no tenderness or swelling ?Wrists full ROM no tenderness or swelling ?Fingers full ROM no tenderness or swelling ?Mild right sided paraspinal muscle tenderness with increased muscle tone compared to left side ?Knees full ROM no tenderness or swelling ?Ankles full ROM no tenderness or swelling ? ?Investigation: ?No additional findings. ? ?Imaging: ?No results found. ? ?Recent Labs: ?Lab Results  ?Component Value Date  ? WBC 7.6 10/08/2020  ? HGB 15.9 10/08/2020  ? PLT 282 10/08/2020  ? NA 136 09/10/2021  ? K 4.8 09/10/2021  ? CL 102 09/10/2021  ? CO2 21 09/10/2021  ? GLUCOSE 111 (H) 09/10/2021  ? BUN 20 09/10/2021  ? CREATININE 1.01 09/10/2021  ? BILITOT 0.2 09/10/2021  ? ALKPHOS 110 09/10/2021  ? AST 76 (H) 09/10/2021  ? ALT 77 (H) 09/10/2021  ? PROT 7.1 09/10/2021  ? ALBUMIN 4.7 09/10/2021  ? CALCIUM 10.0 09/10/2021  ? GFRAA 133 08/12/2020  ? ? ?Speciality Comments: No specialty comments available. ? ?Procedures:  ?No procedures performed ?Allergies: Patient has no known allergies.  ? ?Assessment / Plan:     ?Visit Diagnoses: Elevated CK  ?Myositis, unspecified myositis type, unspecified site - Plan: CK, Aldolase, Myositis Assessr Plus Jo-1 Autoabs ? ?Suspected condition, testing for this as above with CK, aldolase and myositis antibody panel.  His symptoms are not typical of inflammatory myositis with no proximal weakness no significant progression of symptoms for a year despite no medical treatment. It is possible a significant portion may be work and exercise use related muscle turnover although number were quite high. ? ?Elevated liver enzymes ? ?I suspect mild LFT elevation is secondary to muscle turnover products along with the CK. Normal RUQ ultrasound also reassuring. ? ?Orders: ?Orders Placed This Encounter  ?Procedures  ? CK  ? Aldolase  ? Myositis Assessr Plus Jo-1 Autoabs  ? ?No orders of the defined types were placed in this encounter. ? ? ? ?Follow-Up Instructions: No follow-ups on file. ? ? ?10/10/2020, MD ? ?Note - This record has been created using [employer] Plan.  ?  Chart creation errors have been sought, but may not always  ?have been located. Such creation errors do not reflect on  ?the standard of medical care. ? ?

## 2021-11-10 ENCOUNTER — Encounter: Payer: Self-pay | Admitting: Internal Medicine

## 2021-11-10 ENCOUNTER — Encounter: Payer: Self-pay | Admitting: *Deleted

## 2021-11-10 ENCOUNTER — Ambulatory Visit (INDEPENDENT_AMBULATORY_CARE_PROVIDER_SITE_OTHER): Payer: Managed Care, Other (non HMO) | Admitting: Internal Medicine

## 2021-11-10 VITALS — BP 158/91 | HR 64 | Resp 14 | Ht 71.0 in | Wt 241.0 lb

## 2021-11-10 DIAGNOSIS — M609 Myositis, unspecified: Secondary | ICD-10-CM | POA: Diagnosis not present

## 2021-11-10 DIAGNOSIS — R748 Abnormal levels of other serum enzymes: Secondary | ICD-10-CM | POA: Insufficient documentation

## 2021-11-19 LAB — ALDOLASE: Aldolase: 6.9 U/L (ref <=8.1)

## 2021-11-19 LAB — MYOSITIS ASSESSR PLUS JO-1 AUTOABS
EJ Autoabs: NOT DETECTED
Jo-1 Autoabs: <1 AI
Ku Autoabs: NOT DETECTED
Mi-2 Autoabs: NOT DETECTED
OJ Autoabs: NOT DETECTED
PL-12 Autoabs: NOT DETECTED
PL-7 Autoabs: NOT DETECTED
SRP Autoabs: NOT DETECTED

## 2021-11-19 LAB — CK: Total CK: 1120 U/L — ABNORMAL HIGH (ref 44–196)

## 2021-11-22 NOTE — Progress Notes (Signed)
Lab results are negative for any myositis antibodies. His CK is improved compared to before, down to 1100 from 2600. This is still higher than normal but I would not recommend we need any nerve studies or biopsy at this time.

## 2022-01-04 ENCOUNTER — Encounter: Payer: Self-pay | Admitting: *Deleted

## 2022-01-12 ENCOUNTER — Telehealth (HOSPITAL_COMMUNITY): Payer: 59 | Admitting: Psychiatry

## 2022-01-14 ENCOUNTER — Encounter: Payer: Self-pay | Admitting: Student

## 2022-01-14 ENCOUNTER — Ambulatory Visit (INDEPENDENT_AMBULATORY_CARE_PROVIDER_SITE_OTHER): Payer: Managed Care, Other (non HMO) | Admitting: Student

## 2022-01-14 VITALS — BP 130/99 | HR 61 | Ht 71.0 in | Wt 246.2 lb

## 2022-01-14 DIAGNOSIS — M546 Pain in thoracic spine: Secondary | ICD-10-CM

## 2022-01-14 DIAGNOSIS — M25562 Pain in left knee: Secondary | ICD-10-CM

## 2022-01-14 MED ORDER — METHOCARBAMOL 500 MG PO TABS: 500.0000 mg | ORAL_TABLET | Freq: Three times a day (TID) | ORAL | 0 refills | Status: DC | PRN

## 2022-01-14 MED ORDER — MELOXICAM 7.5 MG PO TABS: 7.5000 mg | ORAL_TABLET | Freq: Every day | ORAL | 0 refills | Status: DC

## 2022-01-14 NOTE — Assessment & Plan Note (Signed)
Differential concerning for meniscal tear, knee strain, patellofemoral syndrome, early onset osteoarthritis although unlikely.  No instability of the knee to cause concern for ACL/PCL tear.  Given physical exam, I feel it is appropriate for [place] to send him to sports medicine for possible ultrasound to determine if he has a lateral meniscal tear, may also benefit from x-ray/MRI.  In the meantime, advised lower activity at work (note given) as well as Mobic for 1 to 2 weeks.

## 2022-01-14 NOTE — Patient Instructions (Addendum)
It was great to see you today! Thank you for choosing [facility] for your primary care. Donald Mcintosh was seen for knee pain.  Today we addressed: We are going to refer to sports medicine for possible ultrasound Continue with Mobic for pain for 1-2 weeks daily  Ice and heat as needed  Low impact exercises including water aerobics, bicycle  I have refilled your Robaxin as well    Orders Placed This Encounter  Procedures   Ambulatory referral to Sports Medicine    Referral Priority:   Routine    Referral Type:   Consultation    Number of Visits Requested:   1   Meds ordered this encounter  Medications   meloxicam (MOBIC) 7.5 MG tablet    Sig: Take 1 tablet (7.5 mg total) by mouth daily.    Dispense:  15 tablet    Refill:  0   methocarbamol (ROBAXIN) 500 MG tablet    Sig: Take 1 tablet (500 mg total) by mouth every 8 (eight) hours as needed for muscle spasms.    Dispense:  15 tablet    Refill:  0    If you haven't already, sign up for My Chart to have easy access to your labs results, and communication with your primary care physician.  You should return to our clinic Return in about 1 month (around 02/13/2022).  I recommend that you always bring your medications to each appointment as this makes it easy to ensure you are on the correct medications and helps [place] not miss refills when you need them.  Please arrive 15 minutes before your appointment to ensure smooth check in process.  We appreciate your efforts in making this happen.  Please call the clinic at [phone] if your symptoms worsen or you have any concerns.  Thank you for allowing me to participate in your care, [place] [employer]

## 2022-01-14 NOTE — Assessment & Plan Note (Signed)
Thoracic back pain that has been ongoing for several months now.  Back spasms are intermittent in nature.  Patient requests refill of Robaxin, refill given.

## 2022-01-14 NOTE — Progress Notes (Signed)
    SUBJECTIVE:   CHIEF COMPLAINT / HPI:   Knee Pain:   Presents today with knee pain onset 3 weeks ago. Tried ibuprofen and knee sleeve with no relief Knee imaging in the past: 2014 and were negative at that time Knee surgeries in past no Trauma to cause onset; none Clicking with flexion and ambulation.  Patient denies locking or inability to ambulate.  Suspected myositis from rheumatology with elevated CK on visit 11/14/21, negative for myositis ab with decreasing CK at rheum visit. No further rheum work up indicated.    PERTINENT  PMH / PSH:  Patient Active Problem List   Diagnosis Date Noted   Acute pain of left knee 01/14/2022   Thoracic back pain 10/05/2020   Tinea pedis 09/18/2019   Bipolar disorder (HCC) 09/18/2019   History of ADHD 09/18/2019   Current smoker 09/18/2019     OBJECTIVE:   BP (!) 130/99   Pulse 61   Ht 5\' 11"  (1.803 m)   Wt 246 lb 3.2 oz (111.7 kg)   SpO2 98%   BMI 34.34 kg/m   General: Alert and oriented in no apparent distress; pleasant AA male Heart: Regular rate and rhythm with no murmurs appreciated Lungs: CTA bilaterally, no wheezing Abdomen: Bowel sounds present, no abdominal pain Skin: Warm and dry Extremities: No lower extremity edema  Knee: Able to flex and extend his knee to full limits with some pain associated.  Patient able to ambulate.  Good stability of the knee.  McMurray's negative.  Crepitus presents in lateral aspect of knee with extension greater than 160 degrees.  Thessaly's positive   ASSESSMENT/PLAN:   Thoracic back pain Thoracic back pain that has been ongoing for several months now.  Back spasms are intermittent in nature.  Patient requests refill of Robaxin, refill given.  Acute pain of left knee Differential concerning for meniscal tear, knee strain, patellofemoral syndrome, early onset osteoarthritis although unlikely.  No instability of the knee to cause concern for ACL/PCL tear.  Given physical exam, I feel it  is appropriate for to send him to sports medicine for possible ultrasound to determine if he has a lateral meniscal tear, may also benefit from x-ray/MRI.  In the meantime, advised lower activity at work (note given) as well as Mobic for 1 to 2 weeks.   After rheumatology visit, it was noted that he had a downtrending CK with a maximum CK level 2600.  LFTs were also stable.  He does not need updated labs today, but may consider this in the future to follow trend.   No current history of high blood pressure, recheck pressure at next visit.  [place], MD [facility]

## 2022-01-17 ENCOUNTER — Telehealth: Payer: Self-pay | Admitting: Student

## 2022-01-17 NOTE — Telephone Encounter (Signed)
patient dropped off form at front desk for [employer].  Verified that patient section of form has been completed.  Last DOS/WCC with PCP was 01/14/22.  Placed form in blue team folder to be completed by clinical staff.  Vilinda Blanks

## 2022-01-20 ENCOUNTER — Ambulatory Visit (INDEPENDENT_AMBULATORY_CARE_PROVIDER_SITE_OTHER): Payer: Managed Care, Other (non HMO) | Admitting: Sports Medicine

## 2022-01-20 ENCOUNTER — Telehealth: Payer: Self-pay | Admitting: Student

## 2022-01-20 VITALS — BP 132/94 | Ht 71.0 in | Wt 246.0 lb

## 2022-01-20 DIAGNOSIS — M25562 Pain in left knee: Secondary | ICD-10-CM | POA: Diagnosis not present

## 2022-01-20 MED ORDER — DICLOFENAC SODIUM 75 MG PO TBEC: 75.0000 mg | DELAYED_RELEASE_TABLET | Freq: Two times a day (BID) | ORAL | 0 refills | Status: DC | PRN

## 2022-01-20 NOTE — Progress Notes (Signed)
   Donald Mcintosh is a 26 y.o. male who presents to [facility] today for the following:  Left Knee Pain Patient has been having left lateral knee pain for the last 3.5 weeks. Occurred suddenly at work without any preceding injury. He notices that his knee "pops" when he walks. The discomfort will wake him up from sleep. He has been taking Tylenol and Meloxicam for the pain without much relief. He has started to wear a knee brace which eases the pain. He has a remote hx of injury to the left knee when he was 26 years old, from a skating accident. He recalls a "partial rupture" of either his ACL or MCL.   PMH reviewed.  ROS as above. Medications reviewed.  Exam:  BP (!) 132/94   Ht 5\' 11"  (1.803 m)   Wt 246 lb (111.6 kg)   BMI 34.31 kg/m  Gen: Well NAD MSK: Left Knee: - Inspection: no gross deformity b/l. No swelling/effusion, erythema or bruising b/l. Skin intact - Palpation: TTP lateral patella, no ttp of joint line b/l - ROM: full active ROM with flexion and extension in knee and hip b/l - Strength: 5/5 strength b/l - Neuro/vasc: NV intact distally b/l - Special Tests: - LIGAMENTS: negative anterior and posterior drawer, no MCL or LCL laxity  -- MENISCUS: negative McMurray's, equivocal Thessaly  -- PF JOINT: J-sign present.  negative patellar grind, negative patellar apprehension   Limited MSK ultrasound of the left knee was performed.  No evidence of swelling in the suprapatellar pouch.  Visualized medial meniscus unremarkable.  There is a slight amount of edema along the distal iliotibial band.   Assessment and Plan: 1) Acute pain of left knee Acute, without preceding injury. No recent imaging. Examination with tenderness to lateral patella. U/S findings suggestive of possible inflammation to IT band. J-sign present on left patella. Will start with conservative treatments.  -Voltaren BID x5 days, then PRN -X-ray to evaluate structural abnormalities -VMO and IT band exercises -Can  continue knee sleeve for comfort -F/u in 3 weeks. If no improvement and x-ray neg, can consider MRI    PGY-2 Family Medicine   Patient seen and evaluated with the resident.  I agree with the above plan of care.  Ultrasound is reassuring but I would like to proceed with x-rays of the left knee as well.  Home exercises consisting of IT band stretching and VMO strengthening.  He will continue with his compression sleeve we will change his Mobic to Voltaren.  He will follow-up with me again in 3 weeks we will discuss x-ray results at that time.  We may also need to consider merits of further diagnostic imaging depending on his response to today's treatment and x-ray findings.  Sabino DickMarland Kitchen

## 2022-01-20 NOTE — Telephone Encounter (Signed)
Patient came in checking on the status of the paperwork he dropped off on 01/17/22. States that he needs it as soon as possible to be able to go back to work.

## 2022-01-20 NOTE — Assessment & Plan Note (Signed)
Acute, without preceding injury. No recent imaging. Examination with tenderness to lateral patella. U/S findings suggestive of possible inflammation to IT band. J-sign present on left patella. Will start with conservative treatments.  -Voltaren BID x5 days, then PRN -X-ray to evaluate structural abnormalities -VMO and IT band exercises -Can continue knee sleeve for comfort -F/u in 3 weeks. If no improvement and x-ray neg, can consider MRI

## 2022-01-20 NOTE — Patient Instructions (Signed)
It was nice to meet you today!  I suspect that your pain is related to your IT band.   Our plan:  -Take Voltaren with food twice a day for 5 days, then as needed -X-ray of your knee. You can get this over at [facility] -We have provided you with exercises to help strengthen your quads and IT band -Follow up in 3 weeks; we may consider additional imaging if not improving

## 2022-01-24 ENCOUNTER — Ambulatory Visit
Admission: RE | Admit: 2022-01-24 | Discharge: 2022-01-24 | Disposition: A | Discharge status: Home / Self Care | Payer: Managed Care, Other (non HMO) | Source: Ambulatory Visit | Attending: Sports Medicine | Admitting: Sports Medicine

## 2022-01-24 DIAGNOSIS — M25562 Pain in left knee: Secondary | ICD-10-CM

## 2022-01-24 NOTE — Telephone Encounter (Signed)
Patient needs paperwork for knee pain, wondering since you seen him and gave him a note if you would be able to complete this. I have placed forms in your box.   Thanks!

## 2022-02-03 ENCOUNTER — Ambulatory Visit: Payer: Managed Care, Other (non HMO) | Admitting: Sports Medicine

## 2022-02-10 ENCOUNTER — Ambulatory Visit (INDEPENDENT_AMBULATORY_CARE_PROVIDER_SITE_OTHER): Payer: Managed Care, Other (non HMO) | Admitting: Sports Medicine

## 2022-02-10 VITALS — BP 142/102 | Ht 71.0 in | Wt 246.0 lb

## 2022-02-10 DIAGNOSIS — M25562 Pain in left knee: Secondary | ICD-10-CM

## 2022-02-10 NOTE — Progress Notes (Signed)
   Subjective:    Patient ID: Donald Mcintosh, male    DOB: 08-22-1995, 26 y.o.   MRN: [id number]  HPI  Donald Mcintosh presents today for follow-up on left knee pain.  He still experiencing pain throughout the knee which is worse with going downstairs.  X-rays of the knee were fairly unremarkable.  He has found compression to be helpful.  He denies any swelling.  He would like to return to work.    Review of Systems As above    Objective:   Physical Exam  Developed, well nourished.  No acute distress  Left knee: Good range of motion.  No obvious effusion.  No tenderness to palpation along medial or lateral joint lines.  Negative McMurray's.  Knee remained stable ligamentous exam.  Neurovascularly intact distally.  X-rays as above      Assessment & Plan:   Persistent left knee pain  Patient feels like his symptoms have improved enough that he would like to return to work.  However, he is still endorsing symptoms with certain activities such as going downstairs.  I did recommend that we evaluate further with an MRI of his knee.  He would like to check with his insurance first on the cost of the study before proceeding.  If he does not fact proceed with MRI then I will follow-up with him via telephone with those results.  In the meantime, he will need to continue wearing his compression sleeve at work.  This note was dictated using Dragon naturally speaking software and may contain errors in syntax, spelling, or content which have not been identified prior to signing this note.

## 2022-03-19 ENCOUNTER — Encounter (HOSPITAL_COMMUNITY): Payer: Self-pay

## 2022-03-19 ENCOUNTER — Ambulatory Visit (HOSPITAL_COMMUNITY)
Admission: EM | Admit: 2022-03-19 | Discharge: 2022-03-19 | Disposition: A | Discharge status: Home / Self Care | Payer: Managed Care, Other (non HMO) | Attending: Emergency Medicine | Admitting: Emergency Medicine

## 2022-03-19 DIAGNOSIS — Z20822 Contact with and (suspected) exposure to covid-19: Secondary | ICD-10-CM | POA: Insufficient documentation

## 2022-03-19 DIAGNOSIS — B349 Viral infection, unspecified: Secondary | ICD-10-CM | POA: Diagnosis present

## 2022-03-19 LAB — SARS CORONAVIRUS 2 BY RT PCR: SARS Coronavirus 2 by RT PCR: NEGATIVE

## 2022-03-19 NOTE — ED Triage Notes (Signed)
Onset 1-2 days of back pain and headache.Pt is requesting a covid test.

## 2022-03-19 NOTE — Discharge Instructions (Signed)
You were tested for COVID-19 today.  The result of your COVID-19 test will be posted to your MyChart once it is complete, typically this takes 6 to 12 hours.   Please see the enclosed information regarding quarantine and isolation.  Ibuprofen 400 mg and Tylenol 1000 mg every 6-8 hours is recommended for symptomatic treatment of fever and pain.

## 2022-03-19 NOTE — ED Provider Notes (Signed)
[facility]    CSN: [id number] Arrival date & time: 03/19/22  1425    HISTORY   Chief Complaint  Patient presents with   Back Pain   Headache   HPI Donald Mcintosh is a pleasant, 26 y.o. male who presents to urgent care today. Patient reports 1 to 2-day history of back pain and headache.  Patient is requesting COVID-19 testing today.  Patient states family members in his household have similar symptoms.  Patient denies nasal congestion, loss of taste or smell, sore throat, neck pain, nausea, vomiting, diarrhea, cough.  Patient states he is not taking any medications to remedy his symptoms.  Patient denies known sick contacts.  The history is provided by the patient.   Past Medical History:  Diagnosis Date   Asthma    Patient Active Problem List   Diagnosis Date Noted   Acute pain of left knee 01/14/2022   Thoracic back pain 10/05/2020   Tinea pedis 09/18/2019   Bipolar disorder (HCC) 09/18/2019   History of ADHD 09/18/2019   Current smoker 09/18/2019   History reviewed. No pertinent surgical history.  Home Medications    Prior to Admission medications   Medication Sig Start Date End Date Taking? Authorizing Provider  diclofenac (VOLTAREN) 75 MG EC tablet Take 1 tablet (75 mg total) by mouth 2 (two) times daily as needed. 01/20/22   Ralene Cork, DO  methocarbamol (ROBAXIN) 500 MG tablet Take 1 tablet (500 mg total) by mouth every 8 (eight) hours as needed for muscle spasms. 01/14/22   Alfredo Martinez, MD    Family History Family History  Problem Relation Age of Onset   Hypertension Father    Stroke Cousin    Kidney disease Cousin    Cancer Maternal Great-grandmother    Social History Social History   Tobacco Use   Smoking status: Some Days    Types: Cigarettes   Smokeless tobacco: Never  Vaping Use   Vaping Use: Never used  Substance Use Topics   Alcohol use: No   Drug use: No   Allergies   Patient has no known allergies.  Review of  Systems Review of Systems Pertinent findings revealed after performing a 14 point review of systems has been noted in the history of present illness.  Physical Exam Triage Vital Signs ED Triage Vitals  Enc Vitals Group     BP 05/28/21 0827 (!) 147/82     Pulse Rate 05/28/21 0827 72     Resp 05/28/21 0827 18     Temp 05/28/21 0827 98.3 F (36.8 C)     Temp Source 05/28/21 0827 Oral     SpO2 05/28/21 0827 98 %     Weight --      Height --      Head Circumference --      Peak Flow --      Pain Score 05/28/21 0826 5     Pain Loc --      Pain Edu? --      Excl. in GC? --   No data found.  Updated Vital Signs There were no vitals taken for this visit.  Physical Exam Vitals and nursing note reviewed.  Constitutional:      General: He is not in acute distress.    Appearance: Normal appearance. He is not ill-appearing.  HENT:     Head: Normocephalic and atraumatic.     Salivary Glands: Right salivary gland is not diffusely enlarged or tender. Left salivary  gland is not diffusely enlarged or tender.     Right Ear: Tympanic membrane, ear canal and external ear normal. No drainage. No middle ear effusion. There is no impacted cerumen. Tympanic membrane is not erythematous or bulging.     Left Ear: Tympanic membrane, ear canal and external ear normal. No drainage.  No middle ear effusion. There is no impacted cerumen. Tympanic membrane is not erythematous or bulging.     Nose: Nose normal. No nasal deformity, septal deviation, mucosal edema, congestion or rhinorrhea.     Right Turbinates: Not enlarged, swollen or pale.     Left Turbinates: Not enlarged, swollen or pale.     Right Sinus: No maxillary sinus tenderness or frontal sinus tenderness.     Left Sinus: No maxillary sinus tenderness or frontal sinus tenderness.     Mouth/Throat:     Lips: Pink. No lesions.     Mouth: Mucous membranes are moist. No oral lesions.     Pharynx: Oropharynx is clear. Uvula midline. No posterior  oropharyngeal erythema or uvula swelling.     Tonsils: No tonsillar exudate. 0 on the right. 0 on the left.  Eyes:     General: Lids are normal.        Right eye: No discharge.        Left eye: No discharge.     Extraocular Movements: Extraocular movements intact.     Conjunctiva/sclera: Conjunctivae normal.     Right eye: Right conjunctiva is not injected.     Left eye: Left conjunctiva is not injected.  Neck:     Trachea: Trachea and phonation normal.  Cardiovascular:     Rate and Rhythm: Normal rate and regular rhythm.     Pulses: Normal pulses.     Heart sounds: Normal heart sounds. No murmur heard.    No friction rub. No gallop.  Pulmonary:     Effort: Pulmonary effort is normal. No accessory muscle usage, prolonged expiration or respiratory distress.     Breath sounds: Normal breath sounds. No stridor, decreased air movement or transmitted upper airway sounds. No decreased breath sounds, wheezing, rhonchi or rales.  Chest:     Chest wall: No tenderness.  Musculoskeletal:        General: Normal range of motion.     Cervical back: Normal range of motion and neck supple. Normal range of motion.  Lymphadenopathy:     Cervical: No cervical adenopathy.  Skin:    General: Skin is warm and dry.     Findings: No erythema or rash.  Neurological:     General: No focal deficit present.     Mental Status: He is alert and oriented to person, place, and time.  Psychiatric:        Mood and Affect: Mood normal.        Behavior: Behavior normal.     Visual Acuity Right Eye Distance:   Left Eye Distance:   Bilateral Distance:    Right Eye Near:   Left Eye Near:    Bilateral Near:     UC Couse / Diagnostics / Procedures:     Radiology No results found.  Procedures Procedures (including critical care time) EKG  Pending results:  Labs Reviewed  SARS CORONAVIRUS 2 BY RT PCR    Medications Ordered in UC: Medications - No data to display  UC Diagnoses / Final Clinical  Impressions(s)   I have reviewed the triage vital signs and the nursing notes.  Pertinent labs &  imaging results that were available during my care of the patient were reviewed by me and considered in my medical decision making (see chart for details).    Final diagnoses:  Viral illness   Physical exam today is unremarkable, COVID-19 testing performed at patient's request.  We will notify father of results once received.  Ibuprofen and Tylenol recommended for symptoms as needed.  Return precautions advised.  ED Prescriptions   None    PDMP not reviewed this encounter.  Disposition Upon Discharge:  Condition: stable for discharge home Home: take medications as prescribed; routine discharge instructions as discussed; follow up as advised.  Patient presented with an acute illness with associated systemic symptoms and significant discomfort requiring urgent management. In my opinion, this is a condition that a prudent lay person (someone who possesses an average knowledge of health and medicine) may potentially expect to result in complications if not addressed urgently such as respiratory distress, impairment of bodily function or dysfunction of bodily organs.   Routine symptom specific, illness specific and/or disease specific instructions were discussed with the patient and/or caregiver at length.   As such, the patient has been evaluated and assessed, work-up was performed and treatment was provided in alignment with urgent care protocols and evidence based medicine.  Patient/parent/caregiver has been advised that the patient may require follow up for further testing and treatment if the symptoms continue in spite of treatment, as clinically indicated and appropriate.  If the patient was tested for COVID-19, Influenza and/or RSV, then the patient/parent/guardian was advised to isolate at home pending the results of his/her diagnostic coronavirus test and potentially longer if they're  positive. I have also advised pt that if his/her COVID-19 test returns positive, it's recommended to self-isolate for at least 10 days after symptoms first appeared AND until fever-free for 24 hours without fever reducer AND other symptoms have improved or resolved. Discussed self-isolation recommendations as well as instructions for household member/close contacts as per the [facility] and  DH[facility], and also gave patient the COVID packet with this information.  Patient/parent/caregiver has been advised to return to the [facility] or PCP in 3-5 days if no better; to PCP or the Emergency Department if new signs and symptoms develop, or if the current signs or symptoms continue to change or worsen for further workup, evaluation and treatment as clinically indicated and appropriate  The patient will follow up with their current PCP if and as advised. If the patient does not currently have a PCP we will assist them in obtaining one.   The patient may need specialty follow up if the symptoms continue, in spite of conservative treatment and management, for further workup, evaluation, consultation and treatment as clinically indicated and appropriate.  Patient/parent/caregiver verbalized understanding and agreement of plan as discussed.  All questions were addressed during visit.  Please see discharge instructions below for further details of plan.  Discharge Instructions:   Discharge Instructions      You were tested for COVID-19 today.  The result of your COVID-19 test will be posted to your MyChart once it is complete, typically this takes 6 to 12 hours.   Please see the enclosed information regarding quarantine and isolation.  Ibuprofen 400 mg and Tylenol 1000 mg every 6-8 hours is recommended for symptomatic treatment of fever and pain.         This office note has been dictated using Teaching laboratory technician.  Unfortunately, this method of dictation can sometimes lead to typographical or  grammatical errors.  I apologize for your inconvenience in advance if this occurs.  Please do not hesitate to reach out to me if clarification is needed.      Theadora Rama Scales, [place] 03/19/22 [phone]

## 2022-12-17 IMAGING — US US ABDOMEN LIMITED
1 series · 14 of 25 positions shown · non-contrast
Comparison: None.

CLINICAL DATA: elevated liver enzymes

EXAM:
ULTRASOUND ABDOMEN LIMITED RIGHT UPPER QUADRANT

[Series 1: us abdomen limited · 0.23mm/px · 14 of 52 slices shown]
[im 1/52]
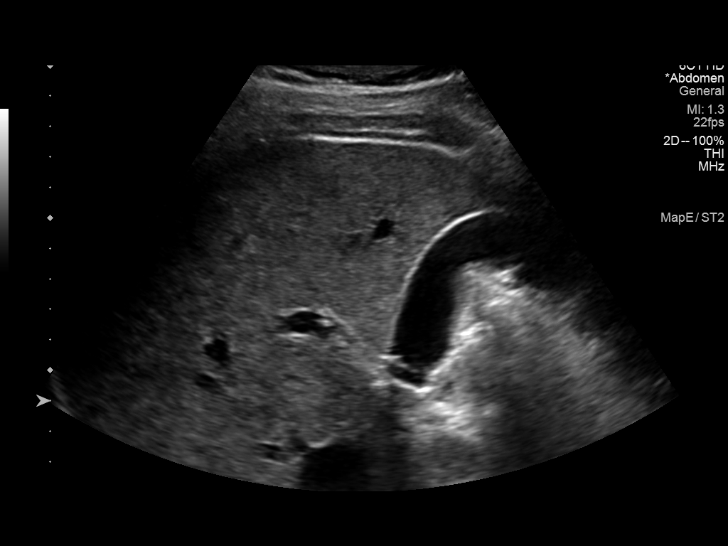
[im 5/52]
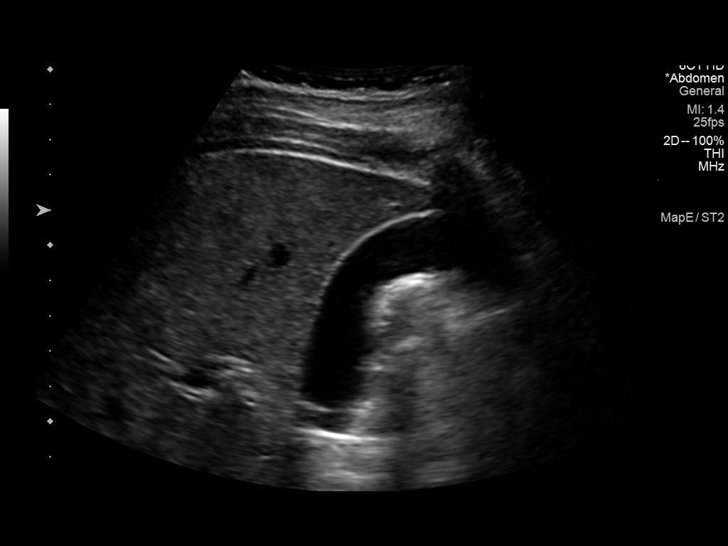
[im 9/52]
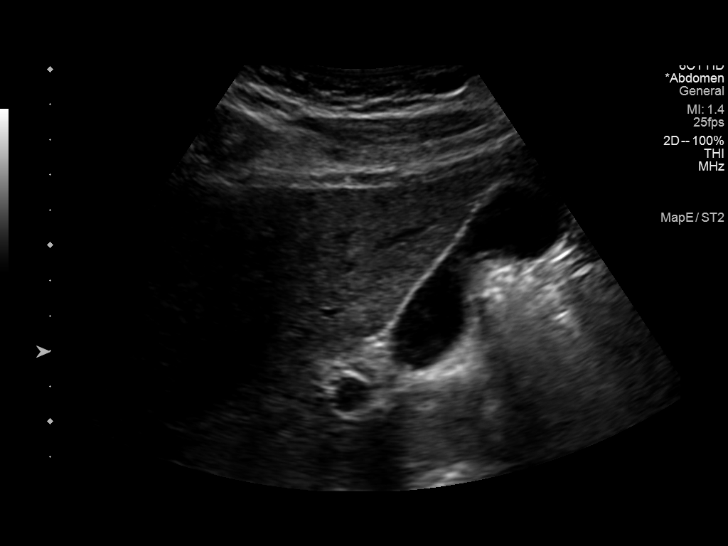
[im 13/52]
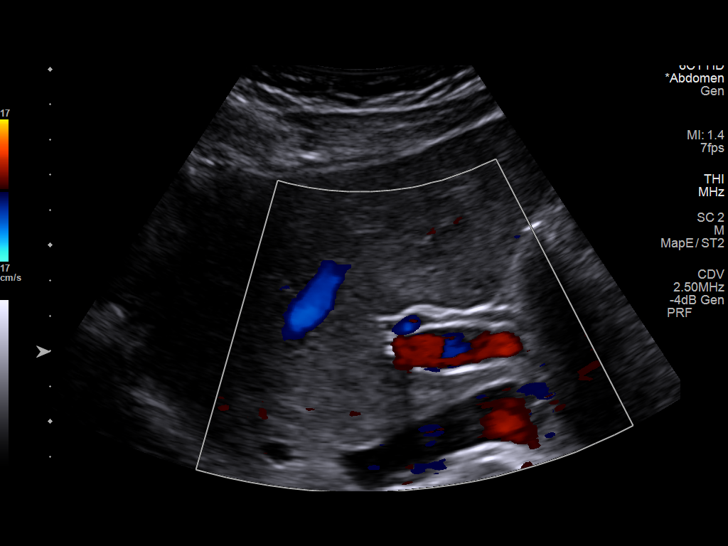
[im 18/52]
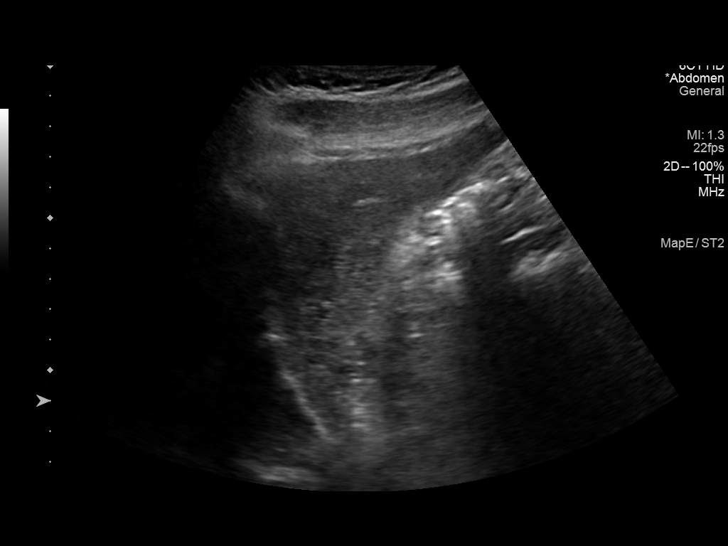
[im 20/52]
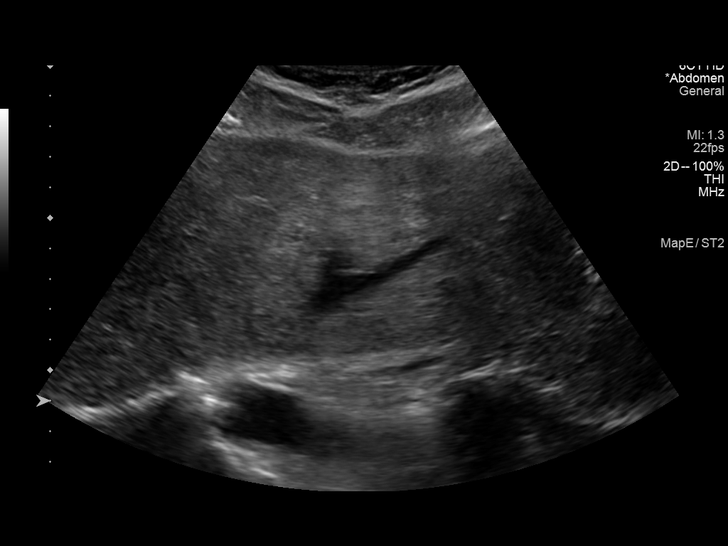
[im 24/52]
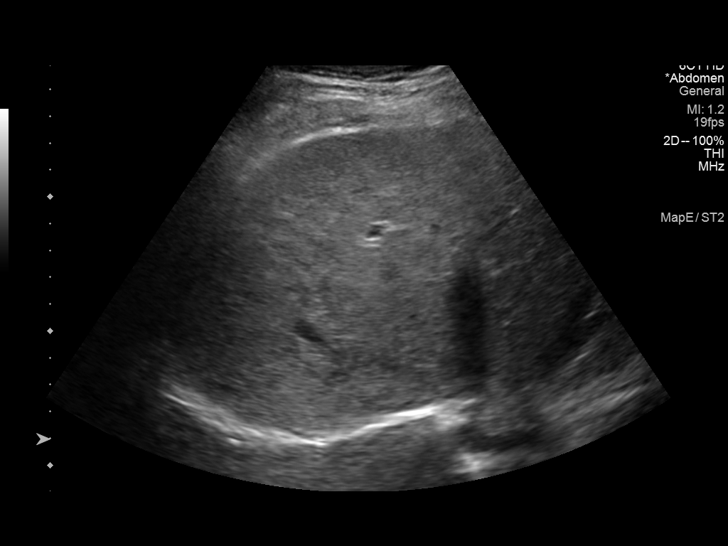
[im 28/52]
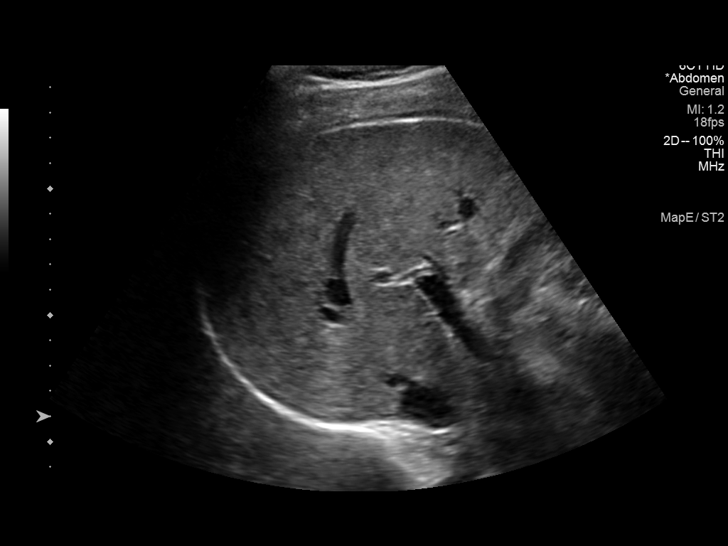
[im 32/52]
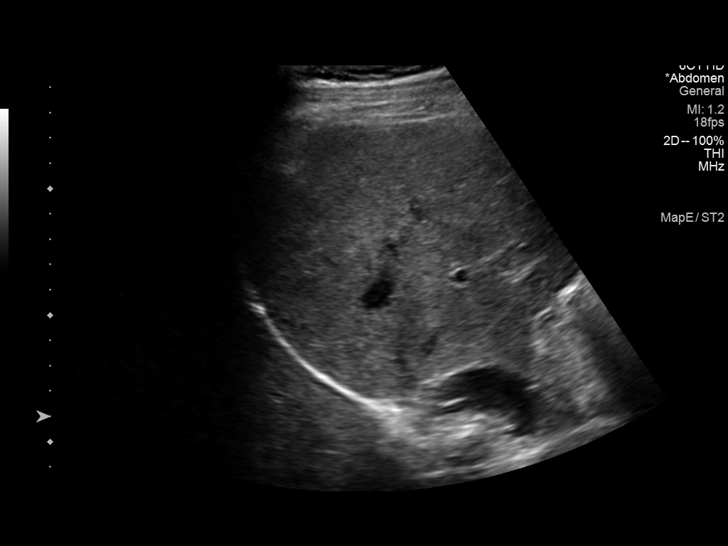
[im 35/52]
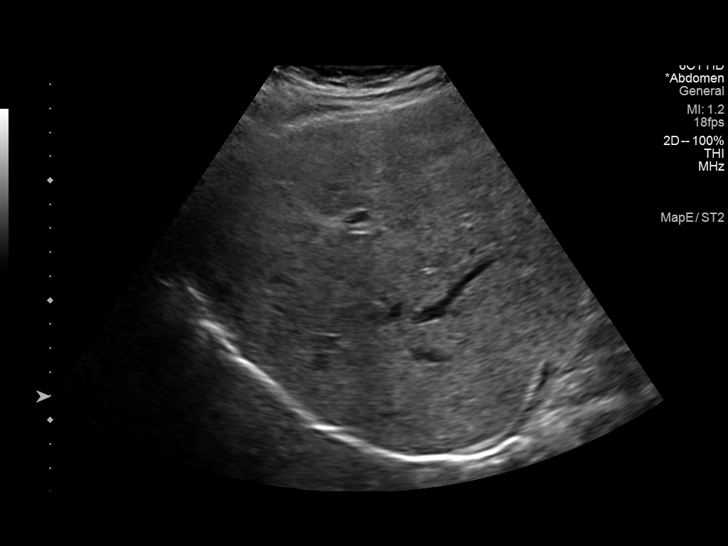
[im 39/52]
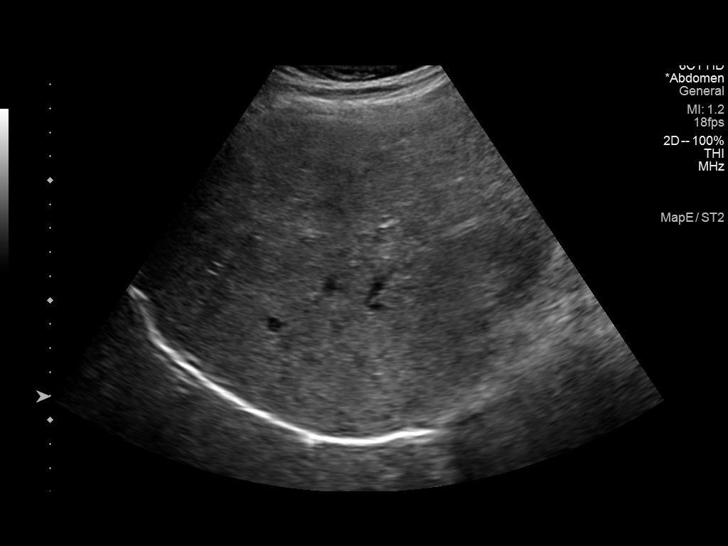
[im 43/52]
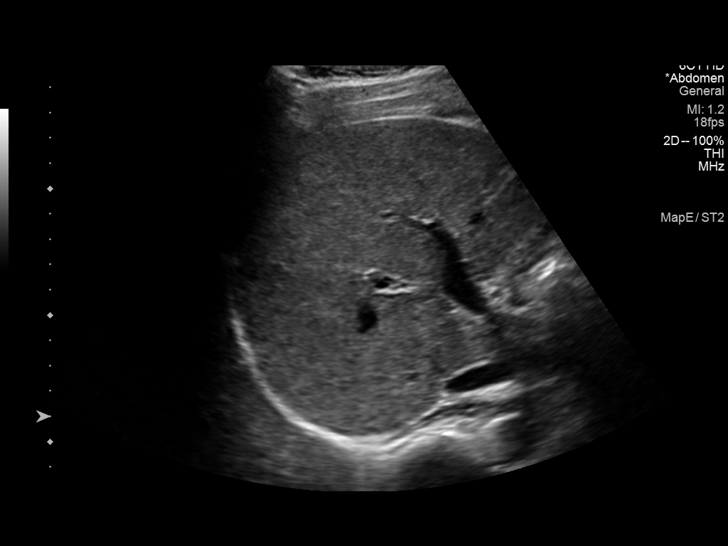
[im 47/52]
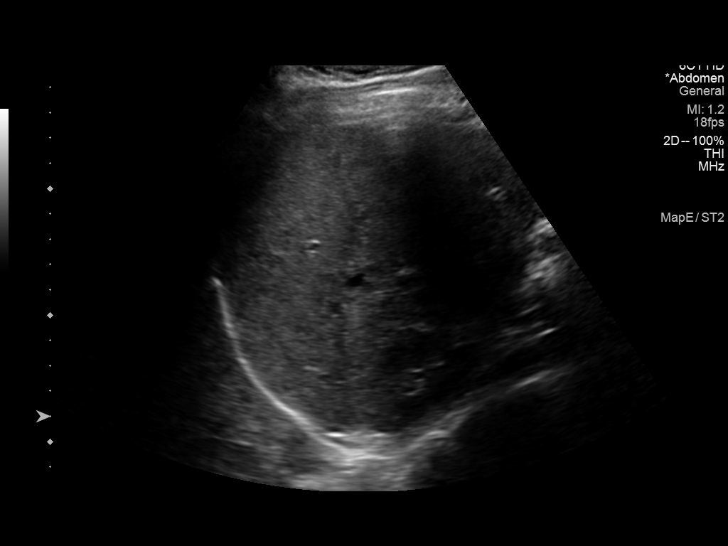
[im 52/52]
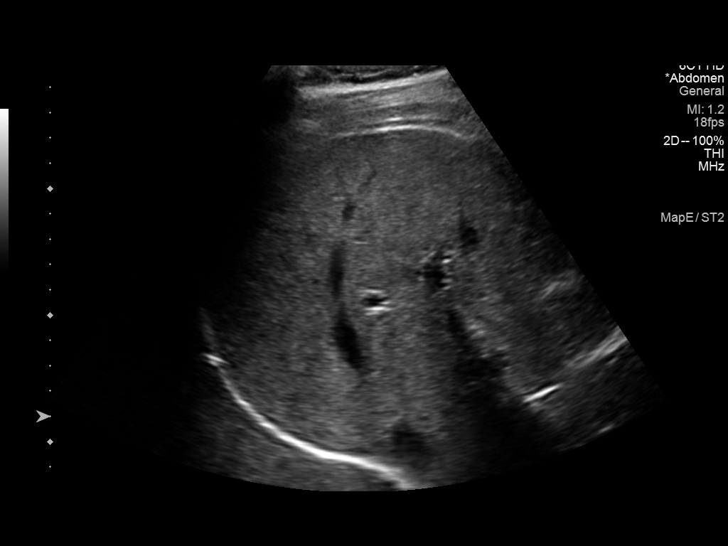

[14 of 25 positions shown; findings below may reference images not displayed]

FINDINGS: Gallbladder:

No gallstones or wall thickening visualized. No sonographic Murphy
sign noted by sonographer.

Common bile duct:

Diameter: 2.4 mm

Liver:

No focal lesion identified. Within normal limits in parenchymal
echogenicity. Portal vein is patent on color Doppler imaging with
normal direction of blood flow towards the liver.

Other: None.
IMPRESSION: Unremarkable right upper quadrant ultrasound.

## 2022-12-29 ENCOUNTER — Encounter: Payer: Self-pay | Admitting: Family Medicine

## 2022-12-29 ENCOUNTER — Ambulatory Visit (INDEPENDENT_AMBULATORY_CARE_PROVIDER_SITE_OTHER): Payer: Self-pay | Admitting: Family Medicine

## 2022-12-29 VITALS — BP 128/94 | HR 82 | Ht 71.0 in | Wt 246.4 lb

## 2022-12-29 DIAGNOSIS — M546 Pain in thoracic spine: Secondary | ICD-10-CM

## 2022-12-29 DIAGNOSIS — N50811 Right testicular pain: Secondary | ICD-10-CM | POA: Insufficient documentation

## 2022-12-29 MED ORDER — METHOCARBAMOL 500 MG PO TABS: 500.0000 mg | ORAL_TABLET | Freq: Three times a day (TID) | ORAL | 0 refills | Status: DC | PRN

## 2022-12-29 NOTE — Assessment & Plan Note (Addendum)
Continue as needed pain management.

## 2022-12-29 NOTE — Progress Notes (Signed)
    SUBJECTIVE:   CHIEF COMPLAINT / HPI: testicular pain  Right testicle felt sharp ache after rearranging genitalia last Thursday. No trauma. No redness or swelling. Pain is worse with leaning and standing up for long periods of time. Pain does not radiate. Ice pack has not helped. Vomited this morning. No other times. Thinks maybe over eat last night. Sexually active with partner no others. No penile discharge or burning with urination. No pain in stomach.   States is in current back pain and spasm.  He is Robaxin as needed which helps.  Splits pills in half as sometimes they are too strong.  PERTINENT  PMH / PSH: Thoracic pain.   OBJECTIVE:   BP (!) 128/94   Pulse 82   Ht 5\' 11"  (1.803 m)   Wt 246 lb 6.4 oz (111.8 kg)   SpO2 97%   BMI 34.37 kg/m   Chaperone present. GU: bilateral testes no obvious asymmetry, no redness or swelling, non-tender to palpation, patient states pain is located at superior aspect of right testicle, no obvious hernia Abdomen:  non tender, soft, no rebound or guarding  ASSESSMENT/PLAN:   Pain in right testicle Assessment & Plan: Clinical history and exam most consistent with epididymal irritation.  Low suspicion for acute pathology such as torsion, trauma or epididymitis.  Exam and vital signs grossly reassuring.  Does not have risk factors for STI.  Discussed trialing NSAIDs for 4 days with expectation that pain will resolve over next week.  Patient would like ultrasound in case this fails.  Instructed to get testicular ultrasound if not improving, cannot rule out intermittent low-grade torsion.  Orders: -     US SCROTUM; Future  Bilateral thoracic back pain, unspecified chronicity Assessment & Plan: Continue as needed pain management.  Orders: -     Methocarbamol; Take 1 tablet (500 mg total) by mouth every 8 (eight) hours as needed for muscle spasms.  Dispense: 15 tablet; Refill: 0   Return if symptoms worsen or fail to improve.  Celine Mans, MD [facility]

## 2022-12-29 NOTE — Patient Instructions (Signed)
It was great to see you! Thank you for allowing me to participate in your care!  I recommend that you always bring your medications to each appointment as this makes it easy to ensure we are on the correct medications and helps [place] not miss when refills are needed.  Our plans for today:  - Your pain is likely from irritation of your epididymis. This likely get better over the next several days. Please take Ibuprofen 600mg  three times per day. - If it is not improving please get the ultrasound of your scrotum. Please seek medical care if you develop redness or swelling of the area.   Please arrive 15 minutes PRIOR to your next scheduled appointment time! If you do not, this affects OTHER patients' care.  Take care and seek immediate care sooner if you develop any concerns.   Dr. Celine Mans, MD [facility]

## 2022-12-29 NOTE — Assessment & Plan Note (Signed)
Clinical history and exam most consistent with epididymal irritation.  Low suspicion for acute pathology such as torsion, trauma or epididymitis.  Exam and vital signs grossly reassuring.  Does not have risk factors for STI.  Discussed trialing NSAIDs for 4 days with expectation that pain will resolve over next week.  Patient would like ultrasound in case this fails.  Instructed to get testicular ultrasound if not improving, cannot rule out intermittent low-grade torsion.

## 2023-01-12 ENCOUNTER — Ambulatory Visit (HOSPITAL_COMMUNITY): Payer: Self-pay | Attending: Family Medicine

## 2023-07-29 ENCOUNTER — Encounter (HOSPITAL_COMMUNITY): Payer: Self-pay

## 2023-07-29 ENCOUNTER — Other Ambulatory Visit: Payer: Self-pay

## 2023-07-29 ENCOUNTER — Emergency Department (HOSPITAL_COMMUNITY)
Admission: EM | Admit: 2023-07-29 | Discharge: 2023-07-29 | Disposition: A | Discharge status: Home / Self Care | Payer: Self-pay | Attending: Emergency Medicine | Admitting: Emergency Medicine

## 2023-07-29 DIAGNOSIS — M546 Pain in thoracic spine: Secondary | ICD-10-CM

## 2023-07-29 DIAGNOSIS — M6282 Rhabdomyolysis: Secondary | ICD-10-CM | POA: Insufficient documentation

## 2023-07-29 LAB — COMPREHENSIVE METABOLIC PANEL
ALT: 65 U/L — ABNORMAL HIGH (ref 0–44)
AST: 59 U/L — ABNORMAL HIGH (ref 15–41)
Albumin: 4.4 g/dL (ref 3.5–5.0)
Alkaline Phosphatase: 83 U/L (ref 38–126)
Anion gap: 12 (ref 5–15)
BUN: 15 mg/dL (ref 6–20)
CO2: 20 mmol/L — ABNORMAL LOW (ref 22–32)
Calcium: 9.3 mg/dL (ref 8.9–10.3)
Chloride: 105 mmol/L (ref 98–111)
Creatinine, Ser: 0.94 mg/dL (ref 0.61–1.24)
GFR, Estimated: >60 mL/min (ref >60)
Glucose, Bld: 113 mg/dL — ABNORMAL HIGH (ref 70–99)
Potassium: 4.2 mmol/L (ref 3.5–5.1)
Sodium: 137 mmol/L (ref 135–145)
Total Bilirubin: 0.5 mg/dL (ref <1.2)
Total Protein: 7.8 g/dL (ref 6.5–8.1)

## 2023-07-29 LAB — URINALYSIS, W/ REFLEX TO CULTURE (INFECTION SUSPECTED)
Bacteria, UA: NONE SEEN
Bilirubin Urine: NEGATIVE
Glucose, UA: NEGATIVE mg/dL
Hgb urine dipstick: NEGATIVE
Ketones, ur: NEGATIVE mg/dL
Leukocytes,Ua: NEGATIVE
Nitrite: NEGATIVE
Protein, ur: NEGATIVE mg/dL
Specific Gravity, Urine: 1.017 (ref 1.005–1.030)
pH: 6 (ref 5.0–8.0)

## 2023-07-29 LAB — CBC WITH DIFFERENTIAL/PLATELET
Abs Immature Granulocytes: 0.02 10*3/uL (ref 0.00–0.07)
Basophils Absolute: 0 10*3/uL (ref 0.0–0.1)
Basophils Relative: 1 %
Eosinophils Absolute: 0.1 10*3/uL (ref 0.0–0.5)
Eosinophils Relative: 1 %
HCT: 42.4 % (ref 39.0–52.0)
Hemoglobin: 14.7 g/dL (ref 13.0–17.0)
Immature Granulocytes: 0 %
Lymphocytes Relative: 35 %
Lymphs Abs: 2.1 10*3/uL (ref 0.7–4.0)
MCH: 31.7 pg (ref 26.0–34.0)
MCHC: 34.7 g/dL (ref 30.0–36.0)
MCV: 91.6 fL (ref 80.0–100.0)
Monocytes Absolute: 0.6 10*3/uL (ref 0.1–1.0)
Monocytes Relative: 11 %
Neutro Abs: 3.2 10*3/uL (ref 1.7–7.7)
Neutrophils Relative %: 52 %
Platelets: 273 10*3/uL (ref 150–400)
RBC: 4.63 MIL/uL (ref 4.22–5.81)
RDW: 12.5 % (ref 11.5–15.5)
WBC: 6.1 10*3/uL (ref 4.0–10.5)
nRBC: 0 % (ref 0.0–0.2)

## 2023-07-29 LAB — CK
Total CK: 2034 U/L — ABNORMAL HIGH (ref 49–397)
Total CK: 1181 U/L — ABNORMAL HIGH (ref 49–397)

## 2023-07-29 MED ORDER — SODIUM CHLORIDE 0.9 % IV BOLUS
2000.0000 mL | Freq: Once | INTRAVENOUS | Status: AC
  Administered 2023-07-29: 2000 mL via INTRAVENOUS

## 2023-07-29 MED ORDER — CYCLOBENZAPRINE HCL 10 MG PO TABS
5.0000 mg | ORAL_TABLET | Freq: Once | ORAL | Status: AC
Start: 2023-07-29 — End: 2023-07-29
  Administered 2023-07-29: 5 mg via ORAL
  Filled 2023-07-29: qty 1

## 2023-07-29 MED ORDER — SODIUM CHLORIDE 0.9 % IV BOLUS: 1000.0000 mL | Freq: Once | INTRAVENOUS | Status: DC

## 2023-07-29 MED ORDER — ACETAMINOPHEN 500 MG PO TABS
1000.0000 mg | ORAL_TABLET | Freq: Once | ORAL | Status: AC
Start: 2023-07-29 — End: 2023-07-29
  Administered 2023-07-29: 1000 mg via ORAL
  Filled 2023-07-29: qty 2

## 2023-07-29 MED ORDER — LIDOCAINE HCL 2 % IJ SOLN
INTRAMUSCULAR | Status: AC
  Filled 2023-07-29: qty 20

## 2023-07-29 MED ORDER — METHOCARBAMOL 500 MG PO TABS: 500.0000 mg | ORAL_TABLET | Freq: Three times a day (TID) | ORAL | 0 refills | Status: DC | PRN

## 2023-07-29 NOTE — ED Notes (Signed)
Pt teaching provided on medications that may cause drowsiness. Pt instructed not to drive or operate heavy machinery while taking the prescribed medication. Pt verbalized understanding.   Pt provided discharge instructions and prescription information. Pt was given the opportunity to ask questions and questions were answered.   

## 2023-07-29 NOTE — ED Provider Notes (Signed)
Received signout from previous provider, please see his note for complete H&P.  This is a 27 year old male with history of nontraumatic rhabdomyolysis presenting with complaints of left upper back pain.  Symptom has been waxing waning but has been ongoing for the past month worse in the past week.  He does admits to heavy lifting and exercising.  No new medication.  His initial labs were concerning for a total CK of 2034.  He has normal renal function.  Patient was given Flexeril as well as 2 L of normal saline.  On reassessment he is improved somewhat but still endorsed some left upper back pain.  Tylenol given.  Repeat total CK has markedly improved to 1181.  I strong encouraged patient to stay hydrated and follow-up closely with his PCP for outpatient management.  Otherwise he is stable for discharge.  BP (!) 152/84   Pulse (!) 55   Temp 98 F (36.7 C) (Oral)   Resp 16   Ht 5\' 11"  (1.803 m)   Wt 112 kg   SpO2 99%   BMI 34.45 kg/m   Results for orders placed or performed during the hospital encounter of 07/29/23  Comprehensive metabolic panel   Collection Time: 07/29/23 10:33 AM  Result Value Ref Range   Sodium 137 135 - 145 mmol/L   Potassium 4.2 3.5 - 5.1 mmol/L   Chloride 105 98 - 111 mmol/L   CO2 20 (L) 22 - 32 mmol/L   Glucose, Bld 113 (H) 70 - 99 mg/dL   BUN 15 6 - 20 mg/dL   Creatinine, Ser 6.96 0.61 - 1.24 mg/dL   Calcium 9.3 8.9 - 29.5 mg/dL   Total Protein 7.8 6.5 - 8.1 g/dL   Albumin 4.4 3.5 - 5.0 g/dL   AST 59 (H) 15 - 41 U/L   ALT 65 (H) 0 - 44 U/L   Alkaline Phosphatase 83 38 - 126 U/L   Total Bilirubin 0.5 <1.2 mg/dL   GFR, Estimated >28 >41 mL/min   Anion gap 12 5 - 15  CBC with Differential   Collection Time: 07/29/23 10:33 AM  Result Value Ref Range   WBC 6.1 4.0 - 10.5 K/uL   RBC 4.63 4.22 - 5.81 MIL/uL   Hemoglobin 14.7 13.0 - 17.0 g/dL   HCT 32.4 40.1 - 02.7 %   MCV 91.6 80.0 - 100.0 fL   MCH 31.7 26.0 - 34.0 pg   MCHC 34.7 30.0 - 36.0 g/dL   RDW  25.3 66.4 - 40.3 %   Platelets 273 150 - 400 K/uL   nRBC 0.0 0.0 - 0.2 %   Neutrophils Relative % 52 %   Neutro Abs 3.2 1.7 - 7.7 K/uL   Lymphocytes Relative 35 %   Lymphs Abs 2.1 0.7 - 4.0 K/uL   Monocytes Relative 11 %   Monocytes Absolute 0.6 0.1 - 1.0 K/uL   Eosinophils Relative 1 %   Eosinophils Absolute 0.1 0.0 - 0.5 K/uL   Basophils Relative 1 %   Basophils Absolute 0.0 0.0 - 0.1 K/uL   Immature Granulocytes 0 %   Abs Immature Granulocytes 0.02 0.00 - 0.07 K/uL  Urinalysis, w/ Reflex to Culture (Infection Suspected) -Urine, Clean Catch   Collection Time: 07/29/23 10:33 AM  Result Value Ref Range   Specimen Source URINE, CLEAN CATCH    Color, Urine YELLOW YELLOW   APPearance CLEAR CLEAR   Specific Gravity, Urine 1.017 1.005 - 1.030   pH 6.0 5.0 - 8.0  Glucose, UA NEGATIVE NEGATIVE mg/dL   Hgb urine dipstick NEGATIVE NEGATIVE   Bilirubin Urine NEGATIVE NEGATIVE   Ketones, ur NEGATIVE NEGATIVE mg/dL   Protein, ur NEGATIVE NEGATIVE mg/dL   Nitrite NEGATIVE NEGATIVE   Leukocytes,Ua NEGATIVE NEGATIVE   RBC / HPF 0-5 0 - 5 RBC/hpf   WBC, UA 0-5 0 - 5 WBC/hpf   Bacteria, UA NONE SEEN NONE SEEN   Squamous Epithelial / HPF 0-5 0 - 5 /HPF  CK   Collection Time: 07/29/23 10:33 AM  Result Value Ref Range   Total CK 2,034 (H) 49 - 397 U/L  CK   Collection Time: 07/29/23  4:40 PM  Result Value Ref Range   Total CK 1,181 (H) 49 - 397 U/L   No results found.    Fayrene Helper, PA-C 07/29/23 1751    Rexford Maus, DO 07/29/23 1952

## 2023-07-29 NOTE — ED Provider Notes (Signed)
[place] EMERGENCY DEPARTMENT AT [facility] Provider Note   CSN: [id number] Arrival date & time: 07/29/23  1001     History Chief Complaint  Patient presents with   Back Pain    Donald Mcintosh is a 27 y.o. male.  Patient with past history significant for nontraumatic rhabdomyolysis presents the emergency department concerns of back pain.  Reports has been dealing with intermittent left back pain/spasming for the last week or so.  He does report that he does some heavy lifting at work and so he is unsure if he has potentially strained his back.  States that the initial discomfort has been present for about 1 month since Thanksgiving without notable improvement or acutely worsening last week.  Denies any significant changes in urine output or urine color in the last week.   Back Pain      Home Medications Prior to Admission medications   Medication Sig Start Date End Date Taking? Authorizing Provider  diclofenac (VOLTAREN) 75 MG EC tablet Take 1 tablet (75 mg total) by mouth 2 (two) times daily as needed. 01/20/22   Ralene Cork, DO  methocarbamol (ROBAXIN) 500 MG tablet Take 1 tablet (500 mg total) by mouth every 8 (eight) hours as needed for muscle spasms. 12/29/22   Celine Mans, MD      Allergies    Patient has no known allergies.    Review of Systems   Review of Systems  Musculoskeletal:  Positive for back pain.  All other systems reviewed and are negative.   Physical Exam Updated Vital Signs BP (!) 154/119 (BP Location: Right Arm)   Pulse 61   Temp 98.2 F (36.8 C) (Oral)   Resp 16   Ht 5\' 11"  (1.803 m)   Wt 112 kg   SpO2 95%   BMI 34.45 kg/m  Physical Exam Vitals and nursing note reviewed.  Constitutional:      General: He is not in acute distress.    Appearance: He is well-developed.  HENT:     Head: Normocephalic and atraumatic.  Eyes:     Conjunctiva/sclera: Conjunctivae normal.  Cardiovascular:     Rate and Rhythm: Normal  rate and regular rhythm.     Heart sounds: No murmur heard. Pulmonary:     Effort: Pulmonary effort is normal. No respiratory distress.     Breath sounds: Normal breath sounds.  Abdominal:     Palpations: Abdomen is soft.     Tenderness: There is no abdominal tenderness.  Musculoskeletal:        General: Tenderness present. No swelling, deformity or signs of injury.       Arms:     Cervical back: Neck supple.     Comments: TTP along the thoracic spine  Skin:    General: Skin is warm and dry.     Capillary Refill: Capillary refill takes less than 2 seconds.  Neurological:     Mental Status: He is alert.  Psychiatric:        Mood and Affect: Mood normal.     ED Results / Procedures / Treatments   Labs (all labs ordered are listed, but only abnormal results are displayed) Labs Reviewed  COMPREHENSIVE METABOLIC PANEL - Abnormal; Notable for the following components:      Result Value   CO2 20 (*)    Glucose, Bld 113 (*)    AST 59 (*)    ALT 65 (*)    All other components within normal limits  CK - Abnormal; Notable for the following components:   Total CK 2,034 (*)    All other components within normal limits  CBC WITH DIFFERENTIAL/PLATELET  URINALYSIS, W/ REFLEX TO CULTURE (INFECTION SUSPECTED)    EKG None  Radiology No results found.  Procedures Procedures    Medications Ordered in ED Medications  cyclobenzaprine (FLEXERIL) tablet 5 mg (5 mg Oral Given 07/29/23 1354)  sodium chloride 0.9 % bolus 2,000 mL (2,000 mLs Intravenous New Bag/Given 07/29/23 1446)    ED Course/ Medical Decision Making/ A&P                                 Medical Decision Making Amount and/or Complexity of Data Reviewed Labs: ordered.  Risk Prescription drug management.   This patient presents to the ED for concern of back pain.  Differential diagnosis includes muscle strain, rhabdomyolysis, muscle spasm   Lab Tests:  I Ordered, and personally interpreted labs.  The  pertinent results include: CBC unremarkable, CMP with mild transaminitis otherwise unremarkable, UA without evidence of infection, CK elevated at 2034   Medicines ordered and prescription drug management:  I ordered medication including Flexeril, fluids for muscle spasm, rhabdomyolysis Reevaluation of the patient after these medicines showed that the patient improved I have reviewed the patients home medicines and have made adjustments as needed   Problem List / ED Course:  Patient with past history significant for nontraumatic rhabdomyolysis here with concerns of back pain muscle spasm.  Reports he has been having this ongoing for about 4 weeks with last week acutely worsening.  Does report that he has a physically demanding job and has had maybe some increased straining at work but no obvious or acute injury.  No falls.  Denies any significant changes in urine color or urine output.  Will obtain basic labs and add on CK for evaluation of possible rhabdomyolysis. Patient denies unremarkable.  CK is elevated at 2034.  Slightly higher than baseline but patient has had previous elevation similar to this.  Suspect this is once again in a case of nontraumatic rhabdomyolysis.  Will initiate fluid resuscitation and administer a dose of Flexeril for muscle spasm. 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  3:15PM  Care of Donald Mcintosh transferred to [facility] and Dr. Theresia Lo at the end of my shift as the patient will require reassessment once labs/imaging have resulted. Patient presentation, ED course, and plan of care discussed with review of all pertinent labs and imaging. Please see his/her note for further details regarding further ED course and disposition. Plan at time of handoff is fluid resuscitation and recheck CK. If down-trending, can discharge home. If continuing to elevate, may require admission for rhabdomyolysis. This may be altered or completely  changed at the discretion of the oncoming team pending results of further workup.  Final Clinical Impression(s) / ED Diagnoses Final diagnoses:  Non-traumatic rhabdomyolysis    Rx / DC Orders ED Discharge Orders     None         Smitty Knudsen, PA-C 07/29/23 1526

## 2023-07-29 NOTE — Discharge Instructions (Signed)
Please drink plenty of fluid, avoid strenuous activity, follow-up closely with your primary care provider for outpatient evaluation and managements of your condition.  Return if you have worsening muscle pain, weakness, or dark urine.

## 2023-07-29 NOTE — ED Triage Notes (Addendum)
Pt reports an intermittent left back pain/spasms. Pt he does a lot of lifting for his job. Pt has hx of rhabdomyolysis, concerned that it may have happened again. Denies any N/V/D.  Present since after thanksgiving.

## 2023-09-10 ENCOUNTER — Ambulatory Visit (HOSPITAL_COMMUNITY)
Admission: EM | Admit: 2023-09-10 | Discharge: 2023-09-10 | Disposition: A | Discharge status: Home / Self Care | Payer: Medicaid Other | Attending: Emergency Medicine | Admitting: Emergency Medicine

## 2023-09-10 ENCOUNTER — Encounter (HOSPITAL_COMMUNITY): Payer: Self-pay

## 2023-09-10 DIAGNOSIS — M546 Pain in thoracic spine: Secondary | ICD-10-CM

## 2023-09-10 DIAGNOSIS — J111 Influenza due to unidentified influenza virus with other respiratory manifestations: Secondary | ICD-10-CM

## 2023-09-10 MED ORDER — METHOCARBAMOL 500 MG PO TABS: 500.0000 mg | ORAL_TABLET | Freq: Three times a day (TID) | ORAL | 0 refills | Status: AC | PRN

## 2023-09-10 MED ORDER — OSELTAMIVIR PHOSPHATE 75 MG PO CAPS
75.0000 mg | ORAL_CAPSULE | Freq: Two times a day (BID) | ORAL | 0 refills | Status: AC
Start: 2023-09-10 — End: 2023-09-15

## 2023-09-10 NOTE — ED Provider Notes (Signed)
 [facility]    CSN: [id number] Arrival date & time: 09/10/23  1420      History   Chief Complaint Chief Complaint  Patient presents with   Fever   Cough   Nasal Congestion   Generalized Body Aches   Headache    HPI Donald Mcintosh is a 28 y.o. male.  2 day history of fever, body aches, headache, congestion and cough Tmax 103. This morning 99 No emesis or diarrhea Has been using robitussin. No other meds   Reports sick contacts with flu-symptoms, also kids have been sick  Past Medical History:  Diagnosis Date   Asthma     Patient Active Problem List   Diagnosis Date Noted   Pain in right testicle 12/29/2022   Acute pain of left knee 01/14/2022   Thoracic back pain 10/05/2020   Tinea pedis 09/18/2019   Bipolar disorder (HCC) 09/18/2019   History of ADHD 09/18/2019   Current smoker 09/18/2019    History reviewed. No pertinent surgical history.     Home Medications    Prior to Admission medications   Medication Sig Start Date End Date Taking? Authorizing Provider  oseltamivir  (TAMIFLU ) 75 MG capsule Take 1 capsule (75 mg total) by mouth every 12 (twelve) hours for 5 days. 09/10/23 09/15/23 Yes Daimon Kean, Asberry, PA-C  methocarbamol  (ROBAXIN ) 500 MG tablet Take 1 tablet (500 mg total) by mouth 3 (three) times daily as needed. 09/10/23   Shea Kapur, Asberry, PA-C    Family History Family History  Problem Relation Age of Onset   Hypertension Father    Stroke Cousin    Kidney disease Cousin    Cancer Maternal Great-grandmother     Social History Social History   Tobacco Use   Smoking status: Former    Types: Cigarettes   Smokeless tobacco: Never  Vaping Use   Vaping status: Never Used  Substance Use Topics   Alcohol use: No   Drug use: No     Allergies   Patient has no known allergies.   Review of Systems Review of Systems  Per HPI  Physical Exam Triage Vital Signs ED Triage Vitals  Encounter Vitals Group     BP 09/10/23 1554 (!)  134/99     Systolic BP Percentile --      Diastolic BP Percentile --      Pulse Rate 09/10/23 1554 85     Resp 09/10/23 1554 16     Temp 09/10/23 1554 98.1 F (36.7 C)     Temp Source 09/10/23 1554 Oral     SpO2 09/10/23 1554 97 %     Weight --      Height --      Head Circumference --      Peak Flow --      Pain Score 09/10/23 1556 4     Pain Loc --      Pain Education --      Exclude from Growth Chart --    No data found.  Updated Vital Signs BP (!) 134/99 (BP Location: Left Arm)   Pulse 85   Temp 98.1 F (36.7 C) (Oral)   Resp 16   SpO2 97%   Visual Acuity Right Eye Distance:   Left Eye Distance:   Bilateral Distance:    Right Eye Near:   Left Eye Near:    Bilateral Near:     Physical Exam Vitals and nursing note reviewed.  Constitutional:      General: He  is not in acute distress.    Appearance: He is not ill-appearing.  HENT:     Right Ear: Tympanic membrane and ear canal normal.     Left Ear: Tympanic membrane and ear canal normal.     Nose: No congestion or rhinorrhea.     Mouth/Throat:     Mouth: Mucous membranes are moist.     Pharynx: Oropharynx is clear. No posterior oropharyngeal erythema.  Eyes:     Conjunctiva/sclera: Conjunctivae normal.  Cardiovascular:     Rate and Rhythm: Normal rate and regular rhythm.     Pulses: Normal pulses.     Heart sounds: Normal heart sounds.  Pulmonary:     Effort: Pulmonary effort is normal.     Breath sounds: Normal breath sounds. No wheezing or rales.  Abdominal:     Palpations: Abdomen is soft.     Tenderness: There is no abdominal tenderness.  Musculoskeletal:     Cervical back: Normal range of motion. No rigidity or tenderness.  Lymphadenopathy:     Cervical: No cervical adenopathy.  Skin:    General: Skin is warm and dry.  Neurological:     Mental Status: He is alert and oriented to person, place, and time.      UC Treatments / Results  Labs (all labs ordered are listed, but only abnormal  results are displayed) Labs Reviewed - No data to display  EKG   Radiology No results found.  Procedures Procedures (including critical care time)  Medications Ordered in UC Medications - No data to display  Initial Impression / Assessment and Plan / UC Course  I have reviewed the triage vital signs and the nursing notes.  Pertinent labs & imaging results that were available during my care of the patient were reviewed by me and considered in my medical decision making (see chart for details).  Currently afebrile, well-appearing, clear lungs With Tmax 103, possible flu exposures, and he is right at the 48-hour window, went ahead and sent Tamiflu .  Discussed viral etiology and other supportive care.  A work note is provided.  Final Clinical Impressions(s) / UC Diagnoses   Final diagnoses:  Influenza-like illness     Discharge Instructions      You likely have the flu, especially with your high fever. The Tamiflu  is an antiviral that can be taken to shorten the duration of symptoms.  It should be started within 48 hours of onset -- for you that would be tonight.  If the pharmacy does not have the medicine available, do not worry about it.  You can just continue symptomatic care.  Drink lots of fluids  I have refilled your robaxin  as well     ED Prescriptions     Medication Sig Dispense Auth. Provider   methocarbamol  (ROBAXIN ) 500 MG tablet Take 1 tablet (500 mg total) by mouth 3 (three) times daily as needed. 30 tablet Jairy Angulo, PA-C   oseltamivir  (TAMIFLU ) 75 MG capsule Take 1 capsule (75 mg total) by mouth every 12 (twelve) hours for 5 days. 10 capsule Friedrich Harriott, Asberry, PA-C      PDMP not reviewed this encounter.   Jeryl Asberry, PA-C 09/10/23 1656

## 2023-09-10 NOTE — ED Triage Notes (Signed)
 Patient c/o fever, cough, nasal congestion, body aches, and headache x 2 days.  Patient states he has been taking Robitussin.

## 2023-09-10 NOTE — Discharge Instructions (Signed)
 You likely have the flu, especially with your high fever. The Tamiflu  is an antiviral that can be taken to shorten the duration of symptoms.  It should be started within 48 hours of onset -- for you that would be tonight.  If the pharmacy does not have the medicine available, do not worry about it.  You can just continue symptomatic care.  Drink lots of fluids  I have refilled your robaxin  as well

## 2023-10-14 IMAGING — DX DG HAND COMPLETE 3+V*L*
3 series · 3 of 3 positions shown · non-contrast
Comparison: None.

CLINICAL DATA: Left hand and finger pain. Hand was caught in the
pallet today. Swelling.

EXAM:
LEFT HAND - COMPLETE 3+ VIEW

[hand pa]
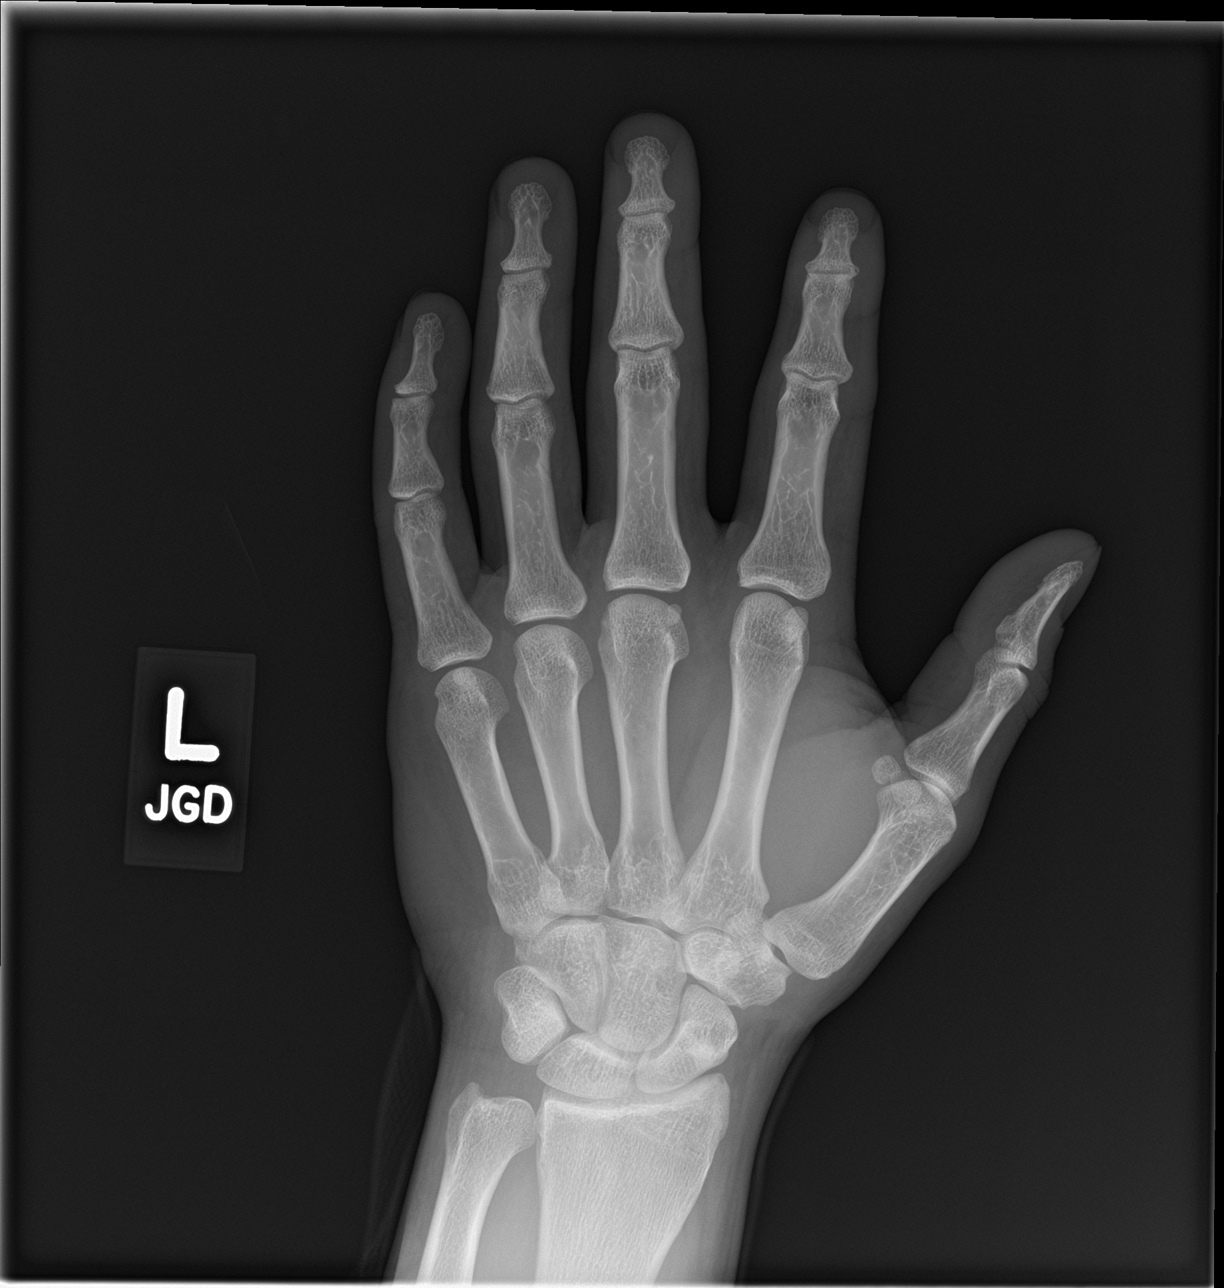

[hand obl]
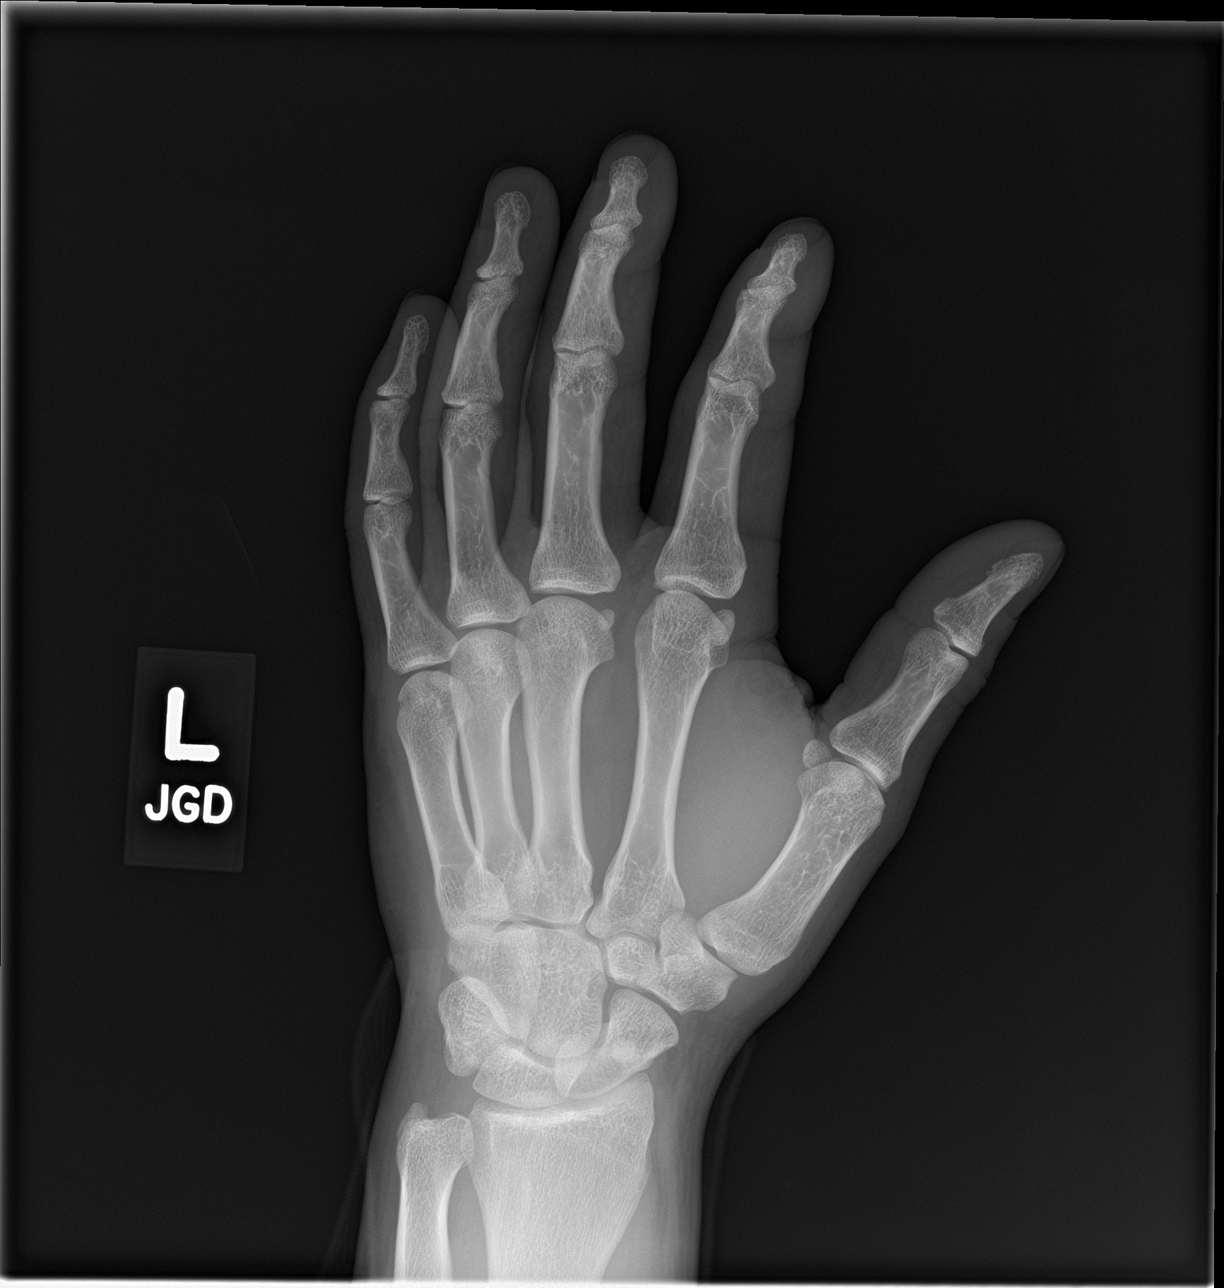

[hand lat]
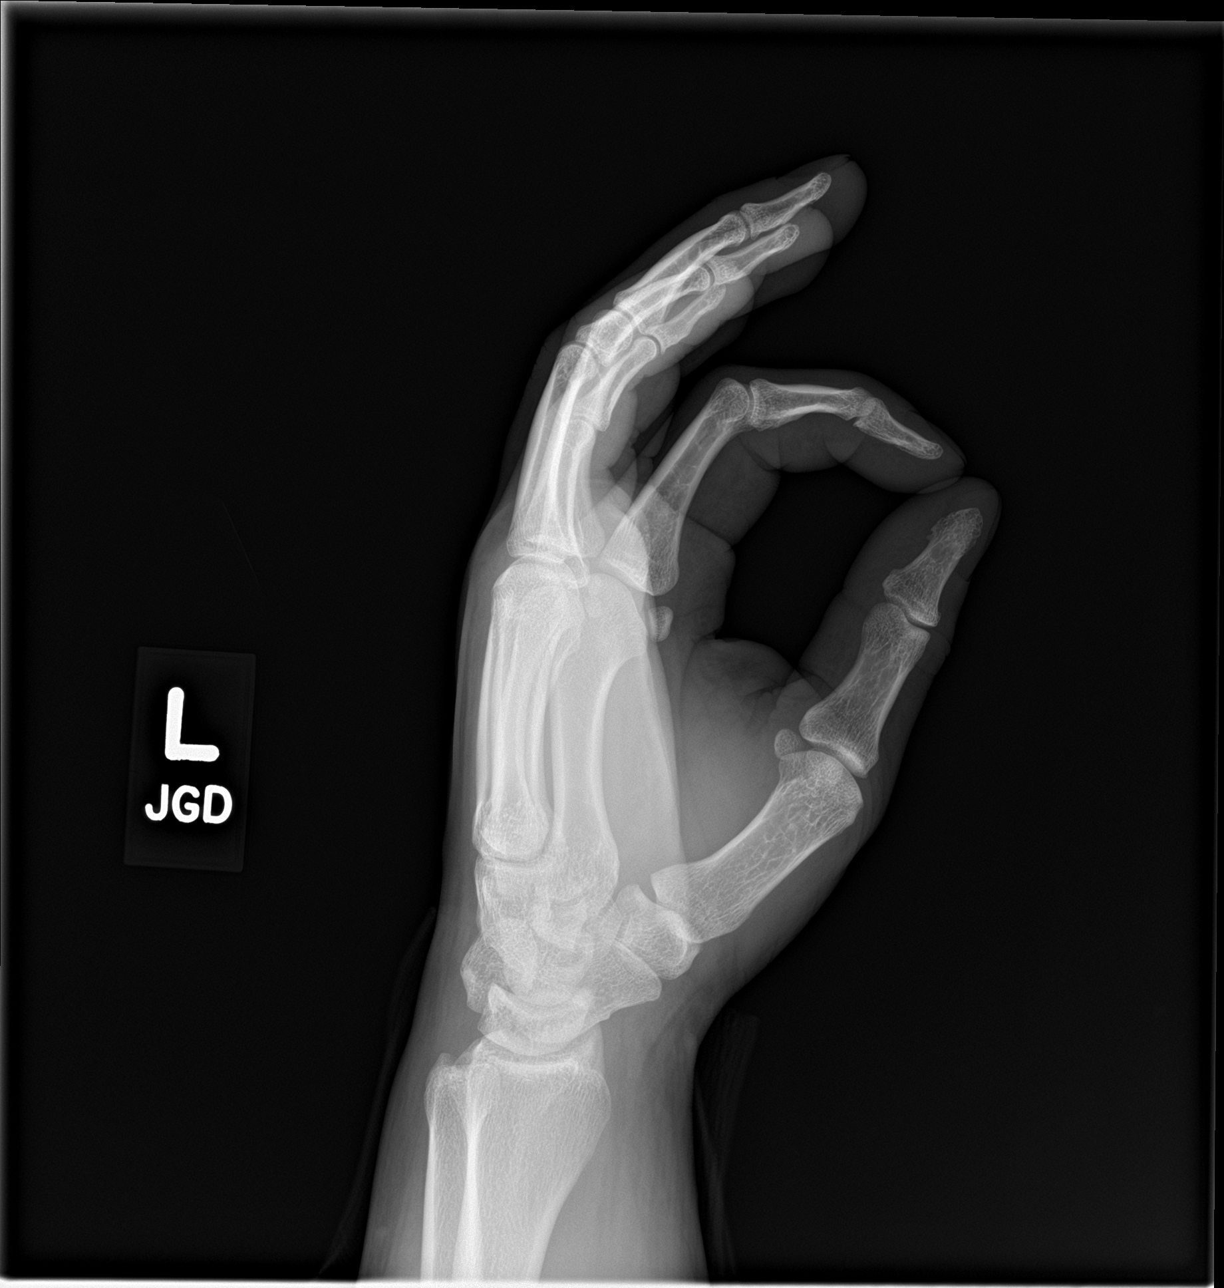

[3 of 3 positions shown; findings below may reference images not displayed]

FINDINGS: There is no evidence of fracture or dislocation. Normal joint spaces
and alignment. There is no evidence of arthropathy or other focal
bone abnormality. Mild soft tissue edema. No soft tissue air.
IMPRESSION: No osseous abnormalities.  Soft tissue edema.

## 2024-01-03 ENCOUNTER — Emergency Department (HOSPITAL_COMMUNITY)
Admission: EM | Admit: 2024-01-03 | Discharge: 2024-01-03 | Disposition: A | Discharge status: Home / Self Care | Payer: Self-pay | Attending: Emergency Medicine | Admitting: Emergency Medicine

## 2024-01-03 ENCOUNTER — Emergency Department (HOSPITAL_COMMUNITY): Payer: Self-pay

## 2024-01-03 ENCOUNTER — Other Ambulatory Visit: Payer: Self-pay

## 2024-01-03 DIAGNOSIS — S61217A Laceration without foreign body of left little finger without damage to nail, initial encounter: Secondary | ICD-10-CM | POA: Insufficient documentation

## 2024-01-03 DIAGNOSIS — Z23 Encounter for immunization: Secondary | ICD-10-CM | POA: Insufficient documentation

## 2024-01-03 DIAGNOSIS — W268XXA Contact with other sharp object(s), not elsewhere classified, initial encounter: Secondary | ICD-10-CM | POA: Insufficient documentation

## 2024-01-03 MED ORDER — LIDOCAINE HCL (PF) 1 % IJ SOLN
5.0000 mL | Freq: Once | INTRAMUSCULAR | Status: AC
  Administered 2024-01-03: 5 mL via INTRADERMAL
  Filled 2024-01-03: qty 30

## 2024-01-03 MED ORDER — TETANUS-DIPHTH-ACELL PERTUSSIS 5-2.5-18.5 LF-MCG/0.5 IM SUSY
0.5000 mL | PREFILLED_SYRINGE | Freq: Once | INTRAMUSCULAR | Status: AC
  Administered 2024-01-03: 0.5 mL via INTRAMUSCULAR
  Filled 2024-01-03: qty 0.5

## 2024-01-03 NOTE — ED Provider Notes (Signed)
 [place] EMERGENCY DEPARTMENT AT [facility] Provider Note   CSN: [id number] Arrival date & time: 01/03/24  0014     History  Chief Complaint  Patient presents with   Laceration    Donald Mcintosh is a 28 y.o. male.  The history is provided by the patient and medical records.  Laceration  28 year old male presenting to the ED with laceration to left fifth digit.  States thinks teapot got too hot and broke his left hand.  Sustained laceration to left pinky finger and minor wound to left lateral wrist.  Unsure of last tetanus.  Home Medications Prior to Admission medications   Medication Sig Start Date End Date Taking? Authorizing Provider  methocarbamol  (ROBAXIN ) 500 MG tablet Take 1 tablet (500 mg total) by mouth 3 (three) times daily as needed. 09/10/23   Rising, Ivette Marks, PA-C      Allergies    Patient has no known allergies.    Review of Systems   Review of Systems  Skin:  Positive for wound.  All other systems reviewed and are negative.   Physical Exam Updated Vital Signs BP (!) 150/96 (BP Location: Left Arm)   Pulse 77   Temp 98.4 F (36.9 C) (Oral)   Resp 18   SpO2 97%   Physical Exam Vitals and nursing note reviewed.  Constitutional:      Appearance: He is well-developed.  HENT:     Head: Normocephalic and atraumatic.  Eyes:     Conjunctiva/sclera: Conjunctivae normal.     Pupils: Pupils are equal, round, and reactive to light.  Cardiovascular:     Rate and Rhythm: Normal rate and regular rhythm.     Heart sounds: Normal heart sounds.  Pulmonary:     Effort: Pulmonary effort is normal.     Breath sounds: Normal breath sounds.  Abdominal:     General: Bowel sounds are normal.     Palpations: Abdomen is soft.  Musculoskeletal:        General: Normal range of motion.     Cervical back: Normal range of motion.     Comments: 3cm semi-circular laceration along lateral dorsal left 5th digit, no active bleeding, able to flex/extend, normal  distal sensation and cap refill Minor superficial abrasion left lateral wrist  Skin:    General: Skin is warm and dry.  Neurological:     Mental Status: He is alert and oriented to person, place, and time.     ED Results / Procedures / Treatments   Labs (all labs ordered are listed, but only abnormal results are displayed) Labs Reviewed - No data to display  EKG None  Radiology DG Hand Complete Left Result Date: 01/03/2024 CLINICAL DATA:  Left hand laceration EXAM: LEFT HAND - COMPLETE 3+ VIEW COMPARISON:  None Available. FINDINGS: There is no evidence of fracture or dislocation. There is no evidence of arthropathy or other focal bone abnormality. Soft tissue defect noted involving the lateral wrist and mild soft tissue swelling seen involving the lateral aspect of the fifth digit at the level of the PIP joint in keeping with given history of laceration. No retained radiopaque foreign body. IMPRESSION: 1. Soft tissue swelling and laceration. No retained radiopaque foreign body. No fracture or dislocation. Electronically Signed   By: Worthy Heads M.D.   On: 01/03/2024 00:55    Procedures Procedures    LACERATION REPAIR Performed by: Coretha Dew Authorized by: Coretha Dew Consent: Verbal consent obtained. Risks and benefits: risks,  benefits and alternatives were discussed Consent given by: patient Patient identity confirmed: provided demographic data Prepped and Draped in normal sterile fashion Wound explored  Laceration Location: left 5th digit  Laceration Length: 3cm  No Foreign Bodies seen or palpated  Anesthesia: local infiltration  Local anesthetic: lidocaine  1% without epinephrine  Anesthetic total: 3 ml  Irrigation method: syringe Amount of cleaning: standard  Skin closure: 4-0 prolene  Number of sutures: 3  Technique: simple interrupted  Patient tolerance: Patient tolerated the procedure well with no immediate complications.   Medications  Ordered in ED Medications - No data to display  ED Course/ Medical Decision Making/ A&P                                 Medical Decision Making Amount and/or Complexity of Data Reviewed Radiology: ordered and independent interpretation performed.  Risk Prescription drug management.   28-year male here with laceration to the left fifth digit after a teapot broke.  He also has a secondary minor abrasion to the left lateral wrist.  Wound is overall superficial without deep tissue, vessel, or tendon involvement.  Hand remains neurovascularly intact.  Normal range of motion of affected digit.  X-ray negative for bony findings or retained FB.  Tetanus updated.  Laceration cleansed and repaired as above, tolerated well.  Discussed home wound care instructions.  PCP/urgent care follow-up for suture removal in about 1 week.  Can return here for new concerns.  Final Clinical Impression(s) / ED Diagnoses Final diagnoses:  Laceration of left little finger without foreign body without damage to nail, initial encounter    Rx / DC Orders ED Discharge Orders     None         Coretha Dew, PA-C 01/03/24 9528    Maralee Senate, April, MD 01/03/24 [phone]

## 2024-01-03 NOTE — ED Triage Notes (Signed)
 Patient report a glass tea pot accidentally break on his hand. Patient sustain a laceration on his left pinky and wrist. Bleeding controlled.

## 2024-01-03 NOTE — Discharge Instructions (Signed)
 Keep sutures clean and dry. They will need to be removed in about 1 week-- your primary care doctor or local urgent care can do this for you. Return here for new concerns.
